# Patient Record
Sex: Female | Born: 1990 | Marital: Single | Smoking: Never smoker
Health system: Southern US, Community
[De-identification: ages and names within clinical notes are randomized; demographics above are authoritative.]

## PROBLEM LIST (undated history)

## (undated) DIAGNOSIS — J45909 Unspecified asthma, uncomplicated: Secondary | ICD-10-CM

---
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
## 2012-11-01 ENCOUNTER — Emergency Department (INDEPENDENT_AMBULATORY_CARE_PROVIDER_SITE_OTHER)
Admission: EM | Admit: 2012-11-01 | Discharge: 2012-11-01 | Disposition: A | Discharge status: Home / Self Care | Payer: Self-pay | Source: Home / Self Care | Attending: Emergency Medicine | Admitting: Emergency Medicine

## 2012-11-01 ENCOUNTER — Encounter (HOSPITAL_COMMUNITY): Payer: Self-pay | Admitting: Emergency Medicine

## 2012-11-01 DIAGNOSIS — J039 Acute tonsillitis, unspecified: Secondary | ICD-10-CM

## 2012-11-01 MED ORDER — PENICILLIN G BENZATHINE 1200000 UNIT/2ML IM SUSP
1.2000 10*6.[IU] | Freq: Once | INTRAMUSCULAR | Status: AC
  Administered 2012-11-01: 1.2 10*6.[IU] via INTRAMUSCULAR

## 2012-11-01 MED ORDER — PENICILLIN G BENZATHINE 1200000 UNIT/2ML IM SUSP
INTRAMUSCULAR | Status: AC
  Filled 2012-11-01: qty 2

## 2012-11-01 NOTE — ED Provider Notes (Signed)
Chief Complaint:   Chief Complaint  Patient presents with  . Sore Throat    History of Present Illness:   Alyssa Roberts is a 22 year old female who has had a two-day history of sore throat, worse on the right than the left with radiation to her right ear and some headache. She denies fever, chills, nasal congestion, rhinorrhea, swollen glands, coughing, or GI symptoms. No known exposure to strep.  Review of Systems:  Other than as noted above, the patient denies any of the following symptoms. Systemic:  No fever, chills, sweats, fatigue, myalgias, headache, or anorexia. Eye:  No redness, pain or drainage. ENT:  No earache, ear congestion, nasal congestion, sneezing, rhinorrhea, sinus pressure, sinus pain, or post nasal drip. Lungs:  No cough, sputum production, wheezing, shortness of breath, or chest pain. GI:  No abdominal pain, nausea, vomiting, or diarrhea. Skin:  No rash or itching.  PMFSH:  Past medical history, family history, social history, meds, allergies, and nurse's notes were reviewed.  There is no known exposure to strep or mono.  No prior history of step or mono.  The patient denies use of tobacco.   Physical Exam:   Vital signs:  BP 100/69  Pulse 54  Temp(Src) 98.7 F (37.1 C) (Oral)  Resp 16  SpO2 100%  LMP 10/13/2012 General:  Alert, in no distress. Eye:  No conjunctival injection or drainage. Lids were normal. ENT:  TMs and canals were normal, without erythema or inflammation.  Nasal mucosa was clear and uncongested, without drainage.  Mucous membranes were moist.  Exam of pharynx reveals tonsils are enlarged, red, and with spots of exudate.  There were no oral ulcerations or lesions. Neck:  Supple, no adenopathy, tenderness or mass. Lungs:  No respiratory distress.  Lungs were clear to auscultation, without wheezes, rales or rhonchi.  Breath sounds were clear and equal bilaterally.  Heart:  Regular rhythm, without gallops, murmers or rubs. Skin:  Clear, warm, and dry,  without rash or lesions.  Labs:   Results for orders placed during the hospital encounter of 11/01/12  POCT RAPID STREP A ([facility] ONLY)      Result Value Range   Streptococcus, Group A Screen (Direct) NEGATIVE  NEGATIVE   Course in Urgent Care Center:   Given LA Bicillin 1.2 million units IM.  Assessment:  The encounter diagnosis was Tonsillitis.  Strep culture is pending.  Plan:   1.  The following meds were prescribed:  There are no discharge medications for this patient.  2.  The patient was instructed in symptomatic care including hot saline gargles, throat lozenges, infectious precautions, and need to trade out toothbrush. Handouts were given. 3.  The patient was told to return if becoming worse in any way, if no better in 3 or 4 days, and given some red flag symptoms such as difficulty swallowing or breathing that would indicate earlier return. 4.  Follow up here if necessary.    Reuben Likes, MD 11/01/12 [phone]

## 2012-11-01 NOTE — ED Notes (Signed)
C/o sore throat.  NAD 

## 2012-11-03 LAB — CULTURE, GROUP A STREP

## 2013-11-30 ENCOUNTER — Encounter (HOSPITAL_COMMUNITY): Payer: Self-pay | Admitting: Emergency Medicine

## 2013-11-30 ENCOUNTER — Emergency Department (INDEPENDENT_AMBULATORY_CARE_PROVIDER_SITE_OTHER)
Admission: EM | Admit: 2013-11-30 | Discharge: 2013-11-30 | Disposition: A | Discharge status: Home / Self Care | Payer: Self-pay | Source: Home / Self Care | Attending: Family Medicine | Admitting: Family Medicine

## 2013-11-30 DIAGNOSIS — J039 Acute tonsillitis, unspecified: Secondary | ICD-10-CM

## 2013-11-30 LAB — POCT RAPID STREP A: Streptococcus, Group A Screen (Direct): NEGATIVE

## 2013-11-30 MED ORDER — AMOXICILLIN 500 MG PO CAPS
500.0000 mg | ORAL_CAPSULE | Freq: Three times a day (TID) | ORAL | Status: DC
Start: 2013-11-30 — End: 2016-06-17

## 2013-11-30 NOTE — ED Notes (Signed)
Patient c/o sore throat with white spots and pain in her ear and neck x 2 days. Patient reports she felt similar sx when she had strep throat before. Patient is alert and oriented and in NAD.

## 2013-11-30 NOTE — ED Provider Notes (Signed)
CSN: [id number]     Arrival date & time 11/30/13  1331 History   First MD Initiated Contact with Patient 11/30/13 1346     Chief Complaint  Patient presents with  . Sore Throat   (Consider location/radiation/quality/duration/timing/severity/associated sxs/prior Treatment) Patient is a 23 y.o. female presenting with pharyngitis. The history is provided by the patient.  Sore Throat This is a new problem. The current episode started 2 days ago. The problem has been gradually worsening.    History reviewed. No pertinent past medical history. History reviewed. No pertinent past surgical history. No family history on file. History  Substance Use Topics  . Smoking status: Never Smoker   . Smokeless tobacco: Not on file  . Alcohol Use: No   OB History   Grav Para Term Preterm Abortions TAB SAB Ect Mult Living                 Review of Systems  Constitutional: Negative.   HENT: Positive for sore throat. Negative for congestion, postnasal drip and rhinorrhea.   Respiratory: Negative.   Cardiovascular: Negative.   Gastrointestinal: Negative.   Hematological: Negative for adenopathy.    Allergies  Review of patient's allergies indicates no known allergies.  Home Medications   Prior to Admission medications   Medication Sig Start Date End Date Taking? Authorizing Provider  amoxicillin (AMOXIL) 500 MG capsule Take 1 capsule (500 mg total) by mouth 3 (three) times daily. 11/30/13   Linna HoffJames D Kindl, MD   BP 118/69  Pulse 65  Temp(Src) 99.5 F (37.5 C) (Oral)  Resp 16  SpO2 98%  LMP 11/23/2013 Physical Exam  Nursing note and vitals reviewed. Constitutional: She is oriented to person, place, and time. She appears well-developed and well-nourished.  HENT:  Head: Normocephalic.  Right Ear: External ear normal.  Left Ear: External ear normal.  Mouth/Throat: Uvula is midline and mucous membranes are normal. Oropharyngeal exudate and posterior oropharyngeal erythema present.  Eyes:  Conjunctivae are normal. Pupils are equal, round, and reactive to light.  Neck: Normal range of motion. Neck supple.  Cardiovascular: Normal heart sounds.   Lymphadenopathy:    She has cervical adenopathy.  Neurological: She is alert and oriented to person, place, and time.    ED Course  Procedures (including critical care time) Labs Review Labs Reviewed  POCT RAPID STREP A ([facility] ONLY)    Imaging Review No results found.   MDM   1. Exudative tonsillitis        Linna HoffJames D Kindl, MD 11/30/13 1445

## 2013-12-02 LAB — CULTURE, GROUP A STREP

## 2014-07-26 ENCOUNTER — Emergency Department (INDEPENDENT_AMBULATORY_CARE_PROVIDER_SITE_OTHER)
Admission: EM | Admit: 2014-07-26 | Discharge: 2014-07-26 | Disposition: A | Discharge status: Home / Self Care | Payer: Self-pay | Source: Home / Self Care | Attending: Family Medicine | Admitting: Family Medicine

## 2014-07-26 DIAGNOSIS — J0391 Acute recurrent tonsillitis, unspecified: Secondary | ICD-10-CM

## 2014-07-26 MED ORDER — CLINDAMYCIN HCL 150 MG PO CAPS: 150.0000 mg | ORAL_CAPSULE | Freq: Four times a day (QID) | ORAL | Status: DC

## 2014-07-26 NOTE — ED Provider Notes (Signed)
CSN: [id number]     Arrival date & time 07/26/14  1423 History   First MD Initiated Contact with Patient 07/26/14 1541     Chief Complaint  Patient presents with  . Sore Throat   (Consider location/radiation/quality/duration/timing/severity/associated sxs/prior Treatment) Patient is a 24 y.o. female presenting with pharyngitis. The history is provided by the patient.  Sore Throat This is a recurrent problem. The current episode started yesterday. The problem has been gradually worsening. The symptoms are aggravated by swallowing.    No past medical history on file. No past surgical history on file. No family history on file. History  Substance Use Topics  . Smoking status: Never Smoker   . Smokeless tobacco: Not on file  . Alcohol Use: No   OB History    No data available     Review of Systems  Constitutional: Negative.   HENT: Positive for sore throat.   Hematological: Positive for adenopathy.    Allergies  Review of patient's allergies indicates no known allergies.  Home Medications   Prior to Admission medications   Medication Sig Start Date End Date Taking? Authorizing Provider  amoxicillin (AMOXIL) 500 MG capsule Take 1 capsule (500 mg total) by mouth 3 (three) times daily. 11/30/13   Linna HoffJames D Ayahna Solazzo, MD  clindamycin (CLEOCIN) 150 MG capsule Take 1 capsule (150 mg total) by mouth 4 (four) times daily. 07/26/14   Linna HoffJames D Philomina Leon, MD   There were no vitals taken for this visit. Physical Exam  Constitutional: She is oriented to person, place, and time. She appears well-developed and well-nourished.  HENT:  Head: Normocephalic.  Right Ear: External ear normal.  Left Ear: External ear normal.  Mouth/Throat: Uvula is midline and mucous membranes are normal. Oropharyngeal exudate, posterior oropharyngeal edema and posterior oropharyngeal erythema present.  Eyes: Conjunctivae are normal. Pupils are equal, round, and reactive to light.  Neck: Normal range of motion. Neck  supple.  Lymphadenopathy:    She has cervical adenopathy.  Neurological: She is alert and oriented to person, place, and time.  Skin: Skin is warm and dry.  Nursing note and vitals reviewed.   ED Course  Procedures (including critical care time) Labs Review Labs Reviewed - No data to display  Imaging Review No results found.   MDM   1. Recurrent tonsillitis        Linna HoffJames D Karlita Lichtman, MD 07/26/14 [phone]

## 2014-07-26 NOTE — ED Notes (Signed)
Pt  Reports      Symptoms  Of   sorethroat  With  Swollen  Glands   And      Spots  On  Back  Of throat          -  Pt    Reports   Has  Had  Throat infections  In  Past          She  Is sitting  Upright  On the   Exam  Table      -

## 2016-06-17 ENCOUNTER — Ambulatory Visit (HOSPITAL_COMMUNITY)
Admission: EM | Admit: 2016-06-17 | Discharge: 2016-06-17 | Disposition: A | Discharge status: Home / Self Care | Payer: Self-pay | Attending: Family Medicine | Admitting: Family Medicine

## 2016-06-17 ENCOUNTER — Ambulatory Visit (INDEPENDENT_AMBULATORY_CARE_PROVIDER_SITE_OTHER): Payer: Self-pay

## 2016-06-17 ENCOUNTER — Encounter (HOSPITAL_COMMUNITY): Payer: Self-pay | Admitting: Family Medicine

## 2016-06-17 DIAGNOSIS — S93411A Sprain of calcaneofibular ligament of right ankle, initial encounter: Secondary | ICD-10-CM

## 2016-06-17 MED ORDER — IBUPROFEN 800 MG PO TABS
800.0000 mg | ORAL_TABLET | Freq: Once | ORAL | Status: AC
  Administered 2016-06-17: 800 mg via ORAL

## 2016-06-17 MED ORDER — MELOXICAM 15 MG PO TABS: 15.0000 mg | ORAL_TABLET | Freq: Every day | ORAL | 2 refills | Status: DC

## 2016-06-17 MED ORDER — IBUPROFEN 800 MG PO TABS
ORAL_TABLET | ORAL | Status: AC
  Filled 2016-06-17: qty 1

## 2016-06-17 NOTE — Discharge Instructions (Signed)
You have a sprained ankle. We have applied an ASO to your ankle and you have been given crutches. I have prescribed a medicine called Meloxicam for pain. Take 1 tablet every day. You may take Tylenol every 4-6 hours for additional pain control, not to exceed 4,000 mg a day of this medicine. I recommend rest, ice, compression through the use of an ASO or Ace bandages, and elevate your ankle as much as possible. Should your pain persist or fail to resolve, follow up with an orthopedist or your primary care provider.  °

## 2016-06-17 NOTE — ED Provider Notes (Signed)
CSN: [id number]     Arrival date & time 06/17/16  1232 History   First MD Initiated Contact with Patient 06/17/16 1328     Chief Complaint  Patient presents with  . Ankle Pain   (Consider location/radiation/quality/duration/timing/severity/associated sxs/prior Treatment) 26 year old female presents with chief complaint of right ankle pain following fall yesterday.   The history is provided by the patient.  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   Entrapped after fall: no   Ankle location:  R ankle Pain details:    Quality:  Throbbing   Radiates to:  Does not radiate   Severity:  Moderate   Onset quality:  Sudden   Duration:  1 day   Timing:  Constant Chronicity:  New Dislocation: no   Foreign body present:  No foreign bodies Tetanus status:  Unknown Prior injury to area:  No Relieved by:  Rest Worsened by:  Bearing weight, extension, flexion and rotation Associated symptoms: decreased ROM, stiffness and swelling   Associated symptoms: no neck pain     History reviewed. No pertinent past medical history. History reviewed. No pertinent surgical history. History reviewed. No pertinent family history. Social History  Substance Use Topics  . Smoking status: Never Smoker  . Smokeless tobacco: Never Used  . Alcohol use No   OB History    No data available     Review of Systems  HENT: Negative.   Respiratory: Negative.   Cardiovascular: Negative.   Genitourinary: Negative.   Musculoskeletal: Positive for joint swelling and stiffness. Negative for myalgias, neck pain and neck stiffness.  Skin: Negative.   Neurological: Negative.   All other systems reviewed and are negative.   Allergies  Patient has no known allergies.  Home Medications   Prior to Admission medications   Medication Sig Start Date End Date  Taking? Authorizing Provider  meloxicam (MOBIC) 15 MG tablet Take 1 tablet (15 mg total) by mouth daily. 06/17/16   Dorena Bodo, NP   Meds Ordered and Administered this Visit   Medications  ibuprofen (ADVIL,MOTRIN) tablet 800 mg (800 mg Oral Given 06/17/16 1351)    BP (!) 107/59   Pulse 74   Temp 98.6 F (37 C)   Resp 18   LMP 05/17/2016 (Approximate)   SpO2 100%  No data found.   Physical Exam  Constitutional: She is oriented to person, place, and time. She appears well-developed and well-nourished. No distress.  HENT:  Head: Normocephalic and atraumatic.  Right Ear: External ear normal.  Left Ear: External ear normal.  Musculoskeletal:       Right ankle: She exhibits decreased range of motion and swelling. Tenderness. CF ligament tenderness found. Achilles tendon exhibits no pain.  Neurological: She is alert and oriented to person, place, and time.  Skin: Skin is warm and dry. Capillary refill takes less than 2 seconds. No rash noted. She is not diaphoretic. No erythema.  Psychiatric: She has a normal mood and affect. Her behavior is normal.  Nursing note and vitals reviewed.   Urgent Care Course     Procedures (including critical care time)  Labs Review Labs Reviewed - No data to display  Imaging Review Dg Ankle Complete Right  Result Date: 06/17/2016 CLINICAL DATA:  Lateral right ankle pain following a fall and  twisting injury 2 days ago. EXAM: RIGHT ANKLE - COMPLETE 3+ VIEW COMPARISON:  None. FINDINGS: There is no evidence of fracture, dislocation, or joint effusion. There is no evidence of arthropathy or other focal bone abnormality. Soft tissues are unremarkable. IMPRESSION: Normal examination. Electronically Signed   By: Beckie Salts M.D.   On: 06/17/2016 13:51       MDM   1. Sprain of calcaneofibular ligament of right ankle, initial encounter    X-ray findings were unremarkable, most likely sprained ankle. Ankle is wrapped in clinic, given crutches,  started on Mobic take and given work excuse. Recommend following up with orthopedics if pain persists     Dorena Bodo, NP 06/17/16 1440

## 2016-06-17 NOTE — ED Triage Notes (Signed)
Pt here for right ankle pain after twisting ankle on Tuesday stepping off of a board.

## 2018-01-08 ENCOUNTER — Encounter (HOSPITAL_COMMUNITY): Payer: Self-pay | Admitting: Emergency Medicine

## 2018-01-08 ENCOUNTER — Ambulatory Visit (HOSPITAL_COMMUNITY)
Admission: EM | Admit: 2018-01-08 | Discharge: 2018-01-08 | Disposition: A | Discharge status: Home / Self Care | Payer: Self-pay | Attending: Family Medicine | Admitting: Family Medicine

## 2018-01-08 DIAGNOSIS — R110 Nausea: Secondary | ICD-10-CM

## 2018-01-08 DIAGNOSIS — K529 Noninfective gastroenteritis and colitis, unspecified: Secondary | ICD-10-CM

## 2018-01-08 DIAGNOSIS — R195 Other fecal abnormalities: Secondary | ICD-10-CM

## 2018-01-08 MED ORDER — ONDANSETRON HCL 4 MG PO TABS: 4.0000 mg | ORAL_TABLET | Freq: Four times a day (QID) | ORAL | 0 refills | Status: DC

## 2018-01-08 NOTE — Discharge Instructions (Signed)

## 2018-01-08 NOTE — ED Provider Notes (Signed)
[facility]   [id number] 01/08/18 Arrival Time: 1406  CC: Nausea and loose stools  SUBJECTIVE:  [place] V Cabriales is a 27 y.o. female who presents with complaint of nausea, and loose stools x 3 episodes that began 1 day ago.  Returned from a cruise yesterday.  Denies abdominal pain.  Has not tried OTC medications.  Symptoms made worse with movement.  Denies similar symptoms in the past.  Last BM this morning with looser stools.  Complains of associated fatigue.    Denies fever, chills, appetite changes, weight changes, vomiting, chest pain, SOB, diarrhea, constipation, hematochezia, melena, dysuria, difficulty urinating, increased frequency or urgency, flank pain, loss of bowel or bladder function, vaginal discharge, vaginal odor, vaginal bleeding, dyspareunia, pelvic pain.     No LMP recorded.  ROS: As per HPI.  History reviewed. No pertinent past medical history. History reviewed. No pertinent surgical history. No Known Allergies No current facility-administered medications on file prior to encounter.    No current outpatient medications on file prior to encounter.   Social History   Socioeconomic History  . Marital status: Single    Spouse name: Not on file  . Number of children: Not on file  . Years of education: Not on file  . Highest education level: Not on file  Occupational History  . Not on file  Social Needs  . Financial resource strain: Not on file  . Food insecurity:    Worry: Not on file    Inability: Not on file  . Transportation needs:    Medical: Not on file    Non-medical: Not on file  Tobacco Use  . Smoking status: Never Smoker  . Smokeless tobacco: Never Used  Substance and Sexual Activity  . Alcohol use: No  . Drug use: No  . Sexual activity: Not on file  Lifestyle  . Physical activity:    Days per week: Not on file    Minutes per session: Not on file  . Stress: Not on file  Relationships  . Social connections:    Talks on phone: Not  on file    Gets together: Not on file    Attends religious service: Not on file    Active member of club or organization: Not on file    Attends meetings of clubs or organizations: Not on file    Relationship status: Not on file  . Intimate partner violence:    Fear of current or ex partner: Not on file    Emotionally abused: Not on file    Physically abused: Not on file    Forced sexual activity: Not on file  Other Topics Concern  . Not on file  Social History Narrative  . Not on file   History reviewed. No pertinent family history.   OBJECTIVE:  Vitals:   01/08/18 1502  BP: 117/85  Pulse: 78  Resp: 18  Temp: 98.7 F (37.1 C)  TempSrc: Oral  SpO2: 98%    General appearance: Alert; NAD HEENT: NCAT.  Oropharynx clear.  Lungs: clear to auscultation bilaterally without adventitious breath sounds Heart: regular rate and rhythm.  Radial pulses 2+ symmetrical bilaterally Abdomen: soft, non-distended; normal active bowel sounds; non-tender to light and deep palpation; nontender at McBurney's point; no guarding Back: no CVA tenderness Extremities: no edema; symmetrical with no gross deformities Skin: warm and dry Neurologic: normal gait Psychological: alert and cooperative; normal mood and affect  ASSESSMENT & PLAN:  1. Nausea   2. Loose stools  Meds ordered this encounter  Medications  . ondansetron (ZOFRAN) 4 MG tablet    Sig: Take 1 tablet (4 mg total) by mouth every 6 (six) hours.    Dispense:  12 tablet    Refill:  0    Order Specific Question:   Supervising Provider    Answer:   Isa Rankin [[id number]]    Get rest and drink fluids Zofran prescribed.  Take as directed.    DIET Instructions:  30 minutes after taking nausea medicine, begin with sips of clear liquids. If able to hold down 2 - 4 ounces for 30 minutes, begin drinking more. Increase your fluid intake to replace losses. Clear liquids only for 24 hours (water, tea, sport drinks, clear  flat ginger ale or cola and juices, broth, jello, popsicles, ect). Advance to bland foods, applesauce, rice, baked or boiled chicken, ect. Avoid milk, greasy foods and anything that doesn't agree with you.  If you experience new or worsening symptoms return or go to ER such as fever, chills, nausea, vomiting, diarrhea, bloody or dark tarry stools, constipation, urinary symptoms, worsening abdominal discomfort, symptoms that do not improve with medications, inability to keep fluids down, etc...  Reviewed expectations re: course of current medical issues. Questions answered. Outlined signs and symptoms indicating need for more acute intervention. Patient verbalized understanding. After Visit Summary given.   Rennis Harding, PA-C 01/08/18 1531

## 2018-01-08 NOTE — ED Triage Notes (Signed)
Pt sts nausea and diarrhea with feeling of "sea sickness" since returning from cruise on Saturday

## 2018-05-06 ENCOUNTER — Emergency Department (HOSPITAL_COMMUNITY)
Admission: EM | Admit: 2018-05-06 | Discharge: 2018-05-06 | Disposition: A | Discharge status: Home / Self Care | Payer: Self-pay | Attending: Emergency Medicine | Admitting: Emergency Medicine

## 2018-05-06 ENCOUNTER — Other Ambulatory Visit: Payer: Self-pay

## 2018-05-06 ENCOUNTER — Encounter (HOSPITAL_COMMUNITY): Payer: Self-pay | Admitting: Oncology

## 2018-05-06 DIAGNOSIS — J029 Acute pharyngitis, unspecified: Secondary | ICD-10-CM | POA: Insufficient documentation

## 2018-05-06 LAB — GROUP A STREP BY PCR: Group A Strep by PCR: NOT DETECTED

## 2018-05-06 MED ORDER — IBUPROFEN 800 MG PO TABS
800.0000 mg | ORAL_TABLET | Freq: Once | ORAL | Status: AC
  Administered 2018-05-06: 800 mg via ORAL
  Filled 2018-05-06: qty 1

## 2018-05-06 NOTE — Discharge Instructions (Signed)
Your strep screen was negative.  Your symptoms are likely due to a viral illness.  We recommend 600 mg ibuprofen every 6 hours for management of pain.  You may use over-the-counter medications such as Chloraseptic spray for continued symptom control.  Drink plenty of fluids to prevent dehydration.

## 2018-05-06 NOTE — ED Triage Notes (Signed)
Pt c/o sore throat x one day.  Reports seeing white patches in the back of her throat.  Pt rates pain 8/10.

## 2018-05-06 NOTE — ED Provider Notes (Signed)
[facility] EMERGENCY DEPARTMENT Provider Note   CSN: [id number] Arrival date & time: 05/06/18  0025    History   Chief Complaint Chief Complaint  Patient presents with  . Sore Throat    HPI Alyssa Roberts is a 28 y.o. female.     The history is provided by the patient. No language interpreter was used.  Sore Throat  This is a new problem. Episode onset: 1.5 days ago. The problem occurs constantly. The problem has been gradually worsening. The symptoms are aggravated by swallowing. Nothing relieves the symptoms. She has tried nothing for the symptoms. The treatment provided no relief.    History reviewed. No pertinent past medical history.  There are no active problems to display for this patient.   History reviewed. No pertinent surgical history.   OB History   No obstetric history on file.      Home Medications    Prior to Admission medications   Not on File    Family History No family history on file.  Social History Social History   Tobacco Use  . Smoking status: Never Smoker  . Smokeless tobacco: Never Used  Substance Use Topics  . Alcohol use: Not Currently  . Drug use: Yes    Types: Marijuana     Allergies   Patient has no known allergies.   Review of Systems Review of Systems  Constitutional: Negative for fever.  HENT: Positive for sore throat. Negative for trouble swallowing.   Ten systems reviewed and are negative for acute change, except as noted in the HPI.    Physical Exam Updated Vital Signs BP 107/62 (BP Location: Left Arm)   Pulse (!) 56   Temp 98.1 F (36.7 C) (Oral)   Resp 18   Ht 5\' 6"  (1.676 m)   Wt 61.2 kg   LMP 04/18/2018 (Approximate)   SpO2 100%   BMI 21.79 kg/m   Physical Exam Vitals signs and nursing note reviewed.  Constitutional:      General: She is not in acute distress.    Appearance: She is well-developed. She is not diaphoretic.     Comments: Nontoxic appearing and in NAD  HENT:      Head: Normocephalic and atraumatic.     Mouth/Throat:     Comments: Normal phonation.  Uvula midline.  There is posterior oropharyngeal erythema.  Scant exudates.  No edema.  Tolerating secretions without difficulty.  No tripoding. Eyes:     General: No scleral icterus.    Conjunctiva/sclera: Conjunctivae normal.  Neck:     Musculoskeletal: Normal range of motion.     Comments: No meningismus Pulmonary:     Effort: Pulmonary effort is normal. No respiratory distress.     Comments: Respirations even and unlabored Musculoskeletal: Normal range of motion.  Skin:    General: Skin is warm and dry.     Coloration: Skin is not pale.     Findings: No erythema or rash.  Neurological:     Mental Status: She is alert and oriented to person, place, and time.  Psychiatric:        Behavior: Behavior normal.      ED Treatments / Results  Labs (all labs ordered are listed, but only abnormal results are displayed) Labs Reviewed  GROUP A STREP BY PCR    EKG None  Radiology No results found.  Procedures Procedures (including critical care time)  Medications Ordered in ED Medications  ibuprofen (ADVIL,MOTRIN) tablet 800 mg (800 mg  Oral Given 05/06/18 0041)     Initial Impression / Assessment and Plan / ED Course  I have reviewed the triage vital signs and the nursing notes.  Pertinent labs & imaging results that were available during my care of the patient were reviewed by me and considered in my medical decision making (see chart for details).        Patient afebrile with negative strep. Presents with sore throat x 1.5 days; diagnosis of viral pharyngitis. No abx indicated. DC with symptomatic tx for pain.  Presentation not concerning for PTA or infxn spread to soft tissue. No trismus or uvula deviation. Return precautions discussed and provided. Patient discharged in stable condition with no unaddressed concerns.   Final Clinical Impressions(s) / ED Diagnoses   Final  diagnoses:  Pharyngitis, unspecified etiology    ED Discharge Orders    None       Antony Madura, PA-C 05/06/18 4580    Glynn Octave, MD 05/06/18 [phone]

## 2018-07-05 ENCOUNTER — Ambulatory Visit (HOSPITAL_COMMUNITY)
Admission: EM | Admit: 2018-07-05 | Discharge: 2018-07-05 | Disposition: A | Discharge status: Home / Self Care | Payer: Self-pay | Attending: Emergency Medicine | Admitting: Emergency Medicine

## 2018-07-05 ENCOUNTER — Encounter (HOSPITAL_COMMUNITY): Payer: Self-pay

## 2018-07-05 ENCOUNTER — Ambulatory Visit (INDEPENDENT_AMBULATORY_CARE_PROVIDER_SITE_OTHER): Payer: Self-pay

## 2018-07-05 DIAGNOSIS — M79671 Pain in right foot: Secondary | ICD-10-CM

## 2018-07-05 DIAGNOSIS — S96911A Strain of unspecified muscle and tendon at ankle and foot level, right foot, initial encounter: Secondary | ICD-10-CM

## 2018-07-05 MED ORDER — NAPROXEN 500 MG PO TABS: 500.0000 mg | ORAL_TABLET | Freq: Two times a day (BID) | ORAL | 0 refills | Status: DC

## 2018-07-05 NOTE — ED Provider Notes (Signed)
[facility]    CSN: [id number] Arrival date & time: 07/05/18  0803     History   Chief Complaint Chief Complaint  Patient presents with  . Ankle Pain    HPI Alyssa Roberts is a 28 y.o. female.   Alyssa Roberts presents with complaints of right dorsal foot pain which started approximately 3 weeks ago. Worse with certain movements or if she steps on anything that then causes extension to her foot. No specific injury but she feels like it did start after she had been running at work. Swelling intermittently. No redness. Pain 5/10. Ibuprofen last dose two days ago. Didn't seem to help with pain. States has had ankle sprains in the past and felt similar. No numbness or tingling. No fevers. No open skin or lesions. She works on her feet regularly, works in [employer]. Without contributing medical history.      ROS per HPI, negative if not otherwise mentioned.      History reviewed. No pertinent past medical history.  There are no active problems to display for this patient.   History reviewed. No pertinent surgical history.  OB History   No obstetric history on file.      Home Medications    Prior to Admission medications   Medication Sig Start Date End Date Taking? Authorizing Provider  naproxen (NAPROSYN) 500 MG tablet Take 1 tablet (500 mg total) by mouth 2 (two) times daily. 07/05/18   Georgetta HaberBurky, Dmarcus Decicco B, NP    Family History Family History  Family history unknown: Yes    Social History Social History   Tobacco Use  . Smoking status: Never Smoker  . Smokeless tobacco: Never Used  Substance Use Topics  . Alcohol use: Not Currently  . Drug use: Yes    Types: Marijuana     Allergies   Patient has no known allergies.   Review of Systems Review of Systems   Physical Exam Triage Vital Signs ED Triage Vitals  Enc Vitals Group     BP 07/05/18 0817 137/90     Pulse Rate 07/05/18 0817 (!) 55     Resp 07/05/18 0817 18     Temp 07/05/18 0817 97.9 F  (36.6 C)     Temp src --      SpO2 07/05/18 0817 100 %     Weight --      Height --      Head Circumference --      Peak Flow --      Pain Score 07/05/18 0815 5     Pain Loc --      Pain Edu? --      Excl. in GC? --    No data found.  Updated Vital Signs BP 137/90   Pulse (!) 55   Temp 97.9 F (36.6 C)   Resp 18   LMP 06/05/2018 (Approximate)   SpO2 100%    Physical Exam Constitutional:      General: She is not in acute distress.    Appearance: She is well-developed.  Cardiovascular:     Rate and Rhythm: Normal rate and regular rhythm.     Heart sounds: Normal heart sounds.  Pulmonary:     Effort: Pulmonary effort is normal.     Breath sounds: Normal breath sounds.  Musculoskeletal:     Right ankle: Normal.     Right foot: Normal capillary refill. Tenderness and bony tenderness present. No swelling, crepitus, deformity or laceration.  Feet:     Comments: Right dorsal, lateral foot with tenderness on palpation; no bruising no swelling; full ROM of ankle and toes; no redness; gross sensation intact; strong pedal pulse; pain with weight bearing with limp noted on ambulation   Skin:    General: Skin is warm and dry.  Neurological:     Mental Status: She is alert and oriented to person, place, and time.      UC Treatments / Results  Labs (all labs ordered are listed, but only abnormal results are displayed) Labs Reviewed - No data to display  EKG None  Radiology Dg Foot Complete Right  Result Date: 07/05/2018 CLINICAL DATA:  28 year old female with right foot pain with no injury EXAM: RIGHT FOOT COMPLETE - 3+ VIEW COMPARISON:  None. FINDINGS: There is no evidence of fracture or dislocation. There is no evidence of arthropathy or other focal bone abnormality. Soft tissues are unremarkable. IMPRESSION: Negative for acute bony abnormality Electronically Signed   By: Gilmer Mor D.O.   On: 07/05/2018 09:21    Procedures Procedures (including critical care  time)  Medications Ordered in UC Medications - No data to display  Initial Impression / Assessment and Plan / UC Course  I have reviewed the triage vital signs and the nursing notes.  Pertinent labs & imaging results that were available during my care of the patient were reviewed by me and considered in my medical decision making (see chart for details).     Xray reassuring. No specific injury. Supportive cares recommended. Follow up with PCP and/or ortho as needed for persistent symptoms. Patient verbalized understanding and agreeable to plan.  Ambulatory out of clinic without difficulty.    Final Clinical Impressions(s) / UC Diagnoses   Final diagnoses:  Strain of right foot, initial encounter     Discharge Instructions     Light and regular activity as tolerated.  Supportive shoes with thick/supportive sole.  ACE wrap or ankle brace for support.  Ice and elevation to help with pain control.  Naproxen twice a day.  Follow up with your PCP and/or orthopedics if no improvement in the next month.     ED Prescriptions    Medication Sig Dispense Auth. Provider   naproxen (NAPROSYN) 500 MG tablet Take 1 tablet (500 mg total) by mouth 2 (two) times daily. 20 tablet Georgetta Haber, NP     Controlled Substance Prescriptions [place] Controlled Substance Registry consulted? Not Applicable   Georgetta Haber, NP 07/05/18 1004

## 2018-07-05 NOTE — Discharge Instructions (Signed)
Light and regular activity as tolerated.  Supportive shoes with thick/supportive sole.  ACE wrap or ankle brace for support.  Ice and elevation to help with pain control.  Naproxen twice a day.  Follow up with your PCP and/or orthopedics if no improvement in the next month.

## 2018-07-05 NOTE — ED Triage Notes (Signed)
Pt states that she began having ankle pain aprx 3 weeks ago, denies injury but states she noticed pain and swelling after running at work

## 2019-07-01 ENCOUNTER — Emergency Department (HOSPITAL_COMMUNITY)
Admission: EM | Admit: 2019-07-01 | Discharge: 2019-07-01 | Disposition: A | Discharge status: Home / Self Care | Payer: Self-pay | Attending: Emergency Medicine | Admitting: Emergency Medicine

## 2019-07-01 ENCOUNTER — Other Ambulatory Visit: Payer: Self-pay

## 2019-07-01 DIAGNOSIS — Y929 Unspecified place or not applicable: Secondary | ICD-10-CM | POA: Insufficient documentation

## 2019-07-01 DIAGNOSIS — Y999 Unspecified external cause status: Secondary | ICD-10-CM | POA: Insufficient documentation

## 2019-07-01 DIAGNOSIS — S39012A Strain of muscle, fascia and tendon of lower back, initial encounter: Secondary | ICD-10-CM | POA: Insufficient documentation

## 2019-07-01 DIAGNOSIS — Y9389 Activity, other specified: Secondary | ICD-10-CM | POA: Insufficient documentation

## 2019-07-01 DIAGNOSIS — X509XXA Other and unspecified overexertion or strenuous movements or postures, initial encounter: Secondary | ICD-10-CM | POA: Insufficient documentation

## 2019-07-01 LAB — POC URINE PREG, ED: Preg Test, Ur: NEGATIVE

## 2019-07-01 MED ORDER — PREDNISONE 20 MG PO TABS: 20.0000 mg | ORAL_TABLET | Freq: Every day | ORAL | 0 refills | Status: AC

## 2019-07-01 MED ORDER — LIDOCAINE 5 % EX PTCH
1.0000 | MEDICATED_PATCH | Freq: Once | CUTANEOUS | Status: DC
  Administered 2019-07-01: 1 via TRANSDERMAL
  Filled 2019-07-01: qty 1

## 2019-07-01 MED ORDER — PREDNISONE 20 MG PO TABS
60.0000 mg | ORAL_TABLET | Freq: Once | ORAL | Status: AC
  Administered 2019-07-01: 60 mg via ORAL
  Filled 2019-07-01: qty 3

## 2019-07-01 MED ORDER — KETOROLAC TROMETHAMINE 15 MG/ML IJ SOLN
15.0000 mg | Freq: Once | INTRAMUSCULAR | Status: AC
  Administered 2019-07-01: 15 mg via INTRAMUSCULAR
  Filled 2019-07-01: qty 1
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
## 2019-07-01 NOTE — ED Triage Notes (Signed)
Pt reports last night was stretching when started having lower back pains that radiates to left buttock. Reports pain was so bad this morning couldn't go to work.

## 2019-07-01 NOTE — Discharge Instructions (Signed)
Your back pain should be treated with medicines such as ibuprofen or tylenol and this back pain should get better over the next 2 weeks.  -Prescription sent to your pharmacy for prednisone. This is a steroid to help with inflammation.   Follow Up: Please follow up with your primary healthcare provider in 1-2 weeks for reassessment. if you do not have a primary care doctor use the resource guide provided to find one.  Low back pain is discomfort in the lower back that may be due to injuries to muscles and ligaments around the spine. Occasionally, it may be caused by a a problem to a part of the spine called a disc. The pain may last several days or a week;  However, most patients get completely well in 4 weeks.   1. Medications: Alternate 600 mg of ibuprofen and [id number] mg of Tylenol every 3 hours as needed for pain. Do not exceed 4000 mg of Tylenol daily.  Take ibuprofen with food to avoid upset stomach issues.   Muscle relaxants:  These medications can help with muscle tightness that is a cause of lower back pain. Most of these medications can cause drowsiness, and it is not safe to drive or use dangerous machinery while taking them.You can take Flexeril as needed for muscle spasm up to twice daily but do not drive, drink alcohol, or operate heavy machinery while taking this medicine because it may make you drowsy.  I typically recommend taking this medicine only at night when you are going to sleep.  You can also cut these tablets in half if they make you feel very drowsy.  2. Treatment: rest, drink plenty of fluids, gentle stretching as discussed (see attached), alternate ice and heat (or stick with whichever feels best) 20 minutes on 20 minutes off. Maintaining your daily activities, including walking, is encourged, as it will help you get better faster than just staying in bed.    Be aware that if you develop new symptoms, such as a fever, leg weakness, difficulty with or loss of control of  your urine or bowels, abdominal pain, or more severe pain, you will need to seek medical attention immediately and  / or return to the Emergency department.

## 2019-07-01 NOTE — ED Notes (Signed)
Pt verbalizes understanding of DC instructions. Pt belongings returned and is ambulatory out of ED.  

## 2019-07-09 ENCOUNTER — Other Ambulatory Visit: Payer: Self-pay

## 2019-07-09 ENCOUNTER — Emergency Department (HOSPITAL_COMMUNITY)
Admission: EM | Admit: 2019-07-09 | Discharge: 2019-07-09 | Disposition: A | Discharge status: Home / Self Care | Payer: Self-pay | Attending: Emergency Medicine | Admitting: Emergency Medicine

## 2019-07-09 ENCOUNTER — Emergency Department (HOSPITAL_COMMUNITY): Payer: Self-pay

## 2019-07-09 ENCOUNTER — Encounter (HOSPITAL_COMMUNITY): Payer: Self-pay

## 2019-07-09 DIAGNOSIS — Y998 Other external cause status: Secondary | ICD-10-CM | POA: Insufficient documentation

## 2019-07-09 DIAGNOSIS — Y929 Unspecified place or not applicable: Secondary | ICD-10-CM | POA: Insufficient documentation

## 2019-07-09 DIAGNOSIS — X58XXXA Exposure to other specified factors, initial encounter: Secondary | ICD-10-CM | POA: Insufficient documentation

## 2019-07-09 DIAGNOSIS — S300XXA Contusion of lower back and pelvis, initial encounter: Secondary | ICD-10-CM | POA: Insufficient documentation

## 2019-07-09 DIAGNOSIS — Y939 Activity, unspecified: Secondary | ICD-10-CM | POA: Insufficient documentation

## 2019-07-09 MED ORDER — TRAMADOL HCL 50 MG PO TABS
50.0000 mg | ORAL_TABLET | Freq: Once | ORAL | Status: DC
Start: 2019-07-09 — End: 2019-07-09
  Filled 2019-07-09: qty 1

## 2019-07-09 MED ORDER — IBUPROFEN 800 MG PO TABS
800.0000 mg | ORAL_TABLET | Freq: Once | ORAL | Status: AC
  Administered 2019-07-09: 800 mg via ORAL
  Filled 2019-07-09: qty 1

## 2019-07-09 MED ORDER — TRAMADOL HCL 50 MG PO TABS: 50.0000 mg | ORAL_TABLET | Freq: Four times a day (QID) | ORAL | 0 refills | Status: DC | PRN

## 2019-07-09 MED ORDER — NAPROXEN 500 MG PO TABS: 500.0000 mg | ORAL_TABLET | Freq: Two times a day (BID) | ORAL | 0 refills | Status: DC

## 2019-07-09 NOTE — Discharge Instructions (Addendum)
Naproxen as prescribed.  Tramadol as prescribed as needed for pain not relieved with naproxen.  Follow-up with your primary doctor if symptoms or not improving in the next week.
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
## 2019-07-09 NOTE — ED Triage Notes (Signed)
Back pain radiating down left leg. Seen here last week for same.

## 2020-01-29 ENCOUNTER — Encounter (HOSPITAL_COMMUNITY): Payer: Self-pay | Admitting: *Deleted

## 2020-01-29 ENCOUNTER — Other Ambulatory Visit: Payer: Self-pay

## 2020-01-29 ENCOUNTER — Ambulatory Visit (HOSPITAL_COMMUNITY)
Admission: EM | Admit: 2020-01-29 | Discharge: 2020-01-29 | Disposition: A | Discharge status: Home / Self Care | Payer: Self-pay | Attending: Family Medicine | Admitting: Family Medicine

## 2020-01-29 DIAGNOSIS — J4541 Moderate persistent asthma with (acute) exacerbation: Secondary | ICD-10-CM

## 2020-01-29 HISTORY — DX: Unspecified asthma, uncomplicated: J45.909

## 2020-01-29 MED ORDER — ALBUTEROL SULFATE HFA 108 (90 BASE) MCG/ACT IN AERS: 1.0000 | INHALATION_SPRAY | Freq: Four times a day (QID) | RESPIRATORY_TRACT | 2 refills | Status: DC | PRN

## 2020-01-29 MED ORDER — PREDNISONE 20 MG PO TABS: 40.0000 mg | ORAL_TABLET | Freq: Every day | ORAL | 0 refills | Status: DC

## 2020-01-29 NOTE — ED Triage Notes (Signed)
Pt reports a HA for one month and a HA. Pt reports she only has a few doses on her HNN.

## 2020-01-30 NOTE — ED Provider Notes (Signed)
[facility]   [id number] 01/29/20 Arrival Time: 1616  ASSESSMENT & PLAN:  1. Moderate persistent asthma with exacerbation     No resp distress.  Meds ordered this encounter  Medications  . albuterol (VENTOLIN HFA) 108 (90 Base) MCG/ACT inhaler    Sig: Inhale 1-2 puffs into the lungs every 6 (six) hours as needed for wheezing or shortness of breath.    Dispense:  1 each    Refill:  2  . predniSONE (DELTASONE) 20 MG tablet    Sig: Take 2 tablets (40 mg total) by mouth daily.    Dispense:  10 tablet    Refill:  0    Asthma precautions given. OTC symptom care as needed.  Recommend:  Follow-up Information    [facility].   Specialty: Urgent Care Why: If worsening or failing to improve as anticipated. Contact information: [address] [place] [address] [phone]              Reviewed expectations re: course of current medical issues. Questions answered. Outlined signs and symptoms indicating need for more acute intervention. Patient verbalized understanding. After Visit Summary given.  SUBJECTIVE: History from: patient.  Alyssa Roberts is a 29 y.o. female who presents with complaint of fairly persistent wheezing. Onset gradual, few d ago. Triggers: change in weather. Describes wheezing as moderate when present. Fever: no. Overall normal PO intake without n/v. Sick contacts: no. Ambulatory without difficulty. No LE edema. Typically her asthma is well controlled. Inhaler use: more frequent. OTC treatment: none.   Social History   Tobacco Use  Smoking Status Never Smoker  Smokeless Tobacco Never Used      OBJECTIVE:  Vitals:   01/29/20 1734 01/29/20 1737  BP: 132/82   Pulse: 65   Resp: 18   Temp: 98.5 F (36.9 C)   TempSrc: Oral   SpO2: 100%   Weight:  63.5 kg  Height:  5\' 6"  (1.676 m)     General appearance: alert; NAD HEENT: [place]; AT; with mild nasal congestion Neck: supple without  LAD Cv: RRR without murmer Lungs: unlabored respirations, moderate bilateral expiratory wheezing; cough: absent; no significant respiratory distress Skin: warm and dry Psychological: alert and cooperative; normal mood and affect    No Known Allergies  Past Medical History:  Diagnosis Date  . Asthma    History reviewed. No pertinent family history. Social History   Socioeconomic History  . Marital status: Single    Spouse name: Not on file  . Number of children: Not on file  . Years of education: Not on file  . Highest education level: Not on file  Occupational History  . Not on file  Tobacco Use  . Smoking status: Never Smoker  . Smokeless tobacco: Never Used  Substance and Sexual Activity  . Alcohol use: No  . Drug use: No  . Sexual activity: Not on file  Other Topics Concern  . Not on file  Social History Narrative  . Not on file   Social Determinants of Health   Financial Resource Strain:   . Difficulty of Paying Living Expenses: Not on file  Food Insecurity:   . Worried About in the Last Year: Not on file  . Ran Out of Food in the Last Year: Not on file  Transportation Needs:   . Lack of Transportation (Medical): Not on file  . Lack of Transportation (Non-Medical): Not on file  Physical Activity:   .  Days of Exercise per Week: Not on file  . Minutes of Exercise per Session: Not on file  Stress:   . Feeling of Stress : Not on file  Social Connections:   . Frequency of Communication with Friends and Family: Not on file  . Frequency of Social Gatherings with Friends and Family: Not on file  . Attends Religious Services: Not on file  . Active Member of Clubs or Organizations: Not on file  . Attends Banker Meetings: Not on file  . Marital Status: Not on file  Intimate Partner Violence:   . Fear of Current or Ex-Partner: Not on file  . Emotionally Abused: Not on file  . Physically Abused: Not on file  . Sexually Abused:  Not on file            Mardella Layman, MD 01/30/20 [phone]

## 2020-03-02 ENCOUNTER — Ambulatory Visit (HOSPITAL_COMMUNITY)
Admission: EM | Admit: 2020-03-02 | Discharge: 2020-03-02 | Disposition: A | Discharge status: Home / Self Care | Payer: 59 | Attending: Emergency Medicine | Admitting: Emergency Medicine

## 2020-03-02 ENCOUNTER — Other Ambulatory Visit: Payer: Self-pay

## 2020-03-02 ENCOUNTER — Ambulatory Visit (INDEPENDENT_AMBULATORY_CARE_PROVIDER_SITE_OTHER): Payer: 59

## 2020-03-02 ENCOUNTER — Encounter (HOSPITAL_COMMUNITY): Payer: Self-pay

## 2020-03-02 DIAGNOSIS — J4521 Mild intermittent asthma with (acute) exacerbation: Secondary | ICD-10-CM

## 2020-03-02 DIAGNOSIS — R0602 Shortness of breath: Secondary | ICD-10-CM

## 2020-03-02 DIAGNOSIS — J45909 Unspecified asthma, uncomplicated: Secondary | ICD-10-CM | POA: Diagnosis not present

## 2020-03-02 MED ORDER — AEROCHAMBER MV MISC: 2 refills | Status: AC

## 2020-03-02 MED ORDER — ALBUTEROL SULFATE HFA 108 (90 BASE) MCG/ACT IN AERS: 1.0000 | INHALATION_SPRAY | Freq: Four times a day (QID) | RESPIRATORY_TRACT | 2 refills | Status: AC | PRN

## 2020-03-02 NOTE — ED Provider Notes (Signed)
[facility]    CSN: [id number] Arrival date & time: 03/02/20  1022      History   Chief Complaint Chief Complaint  Patient presents with  . asthma issues    HPI Alyssa Roberts is a 30 y.o. female.   HPI   30 year old female here for evaluation of shortness of breath.  Patient reports that she has had symptoms for the past 2 months.  She has been using her albuterol rescue inhaler without a spacer with some mild relief.  Patient states that she gets about 5 hours of sleep at night but then wakes up because she feels like mucus has accumulated in her throat and she has to cough which causes increased chest tightness.  Patient states that she does not have wheezing that she is aware of unless she starts coughing.  Patient denies any fever.  Patient reports that her cough is productive for a white mucus.  Patient is an ex-smoker.  She stopped vapes about 2 months ago and does not smoke tobacco or marijuana.  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  Past Medical History:  Diagnosis Date  . Asthma     There are no problems to display for this patient.   History reviewed. No pertinent surgical history.  OB History   No obstetric history on file.      Home Medications    Prior to Admission medications   Medication Sig Start Date End Date Taking? Authorizing Provider  Spacer/Aero-Holding Chambers (AEROCHAMBER MV) inhaler Use as instructed 03/02/20  Yes Margarette Canada, NP  albuterol (VENTOLIN HFA) 108 (90 Base) MCG/ACT inhaler Inhale 1-2 puffs into the lungs every 6 (six) hours as needed for wheezing or shortness of breath. 03/02/20   Margarette Canada, NP  naproxen (NAPROSYN) 500 MG tablet Take 1 tablet (500 mg total) by mouth 2 (two) times daily. 07/05/18   Zigmund Gottron, NP  naproxen (NAPROSYN) 500 MG tablet Take 1 tablet (500 mg total) by mouth 2 (two) times daily. 07/09/19   Veryl Speak, MD  ondansetron (ZOFRAN) 4 MG tablet Take 1 tablet (4 mg total)  by mouth every 6 (six) hours. Patient not taking: Reported on 07/09/2019 01/08/18   Wurst, [place], PA-C  predniSONE (DELTASONE) 20 MG tablet Take 2 tablets (40 mg total) by mouth daily. 01/29/20   Vanessa Kick, MD  traMADol (ULTRAM) 50 MG tablet Take 1 tablet (50 mg total) by mouth every 6 (six) hours as needed. 07/09/19   Veryl Speak, MD    Family History Family History  Family history unknown: Yes    Social History Social History   Tobacco Use  . Smoking status: Never Smoker  . Smokeless tobacco: Never Used  Substance Use Topics  . Alcohol use: Not Currently  . Drug use: Yes    Types: Marijuana     Allergies   Prednisone   Review of Systems Review of Systems  Constitutional: Negative for activity change, appetite change and fever.  HENT: Negative for congestion, rhinorrhea and sore throat.   Respiratory: Positive for cough, shortness of breath and wheezing.   Skin: Negative for rash.  Hematological: Negative.   Psychiatric/Behavioral: Negative.      Physical Exam Triage Vital Signs ED Triage Vitals  Enc Vitals Group     BP 03/02/20 1128 (!) 137/93     Pulse Rate 03/02/20 1128 66     Resp 03/02/20 1128 18     Temp 03/02/20 1128 98.2  F (36.8 C)     Temp Source 03/02/20 1128 Oral     SpO2 03/02/20 1128 97 %     Weight --      Height --      Head Circumference --      Peak Flow --      Pain Score 03/02/20 1123 0     Pain Loc --      Pain Edu? --      Excl. in GC? --    No data found.  Updated Vital Signs BP (!) 137/93 (BP Location: Left Arm)   Pulse 66   Temp 98.2 F (36.8 C) (Oral)   Resp 18   LMP 02/25/2020 (Approximate)   SpO2 97%   Visual Acuity Right Eye Distance:   Left Eye Distance:   Bilateral Distance:    Right Eye Near:   Left Eye Near:    Bilateral Near:     Physical Exam Vitals and nursing note reviewed.  Constitutional:      General: She is not in acute distress.    Appearance: Normal appearance. She is normal weight.  She is not toxic-appearing.  HENT:     Head: Normocephalic and atraumatic.     Mouth/Throat:     Mouth: Mucous membranes are moist.     Pharynx: Oropharynx is clear. No oropharyngeal exudate or posterior oropharyngeal erythema.  Cardiovascular:     Rate and Rhythm: Normal rate and regular rhythm.     Pulses: Normal pulses.     Heart sounds: Normal heart sounds. No murmur heard. No gallop.   Pulmonary:     Effort: Pulmonary effort is normal.     Breath sounds: Normal breath sounds. No stridor. No wheezing, rhonchi or rales.  Musculoskeletal:     Cervical back: Normal range of motion and neck supple.  Lymphadenopathy:     Cervical: No cervical adenopathy.  Skin:    General: Skin is warm and dry.     Capillary Refill: Capillary refill takes less than 2 seconds.     Findings: No erythema or rash.  Neurological:     General: No focal deficit present.     Mental Status: She is alert and oriented to person, place, and time.  Psychiatric:        Mood and Affect: Mood normal.        Behavior: Behavior normal.        Thought Content: Thought content normal.        Judgment: Judgment normal.      UC Treatments / Results  Labs (all labs ordered are listed, but only abnormal results are displayed) Labs Reviewed - No data to display  EKG   Radiology DG Chest 2 View  Result Date: 03/02/2020 CLINICAL DATA:  Asthma, shortness of breath. EXAM: CHEST - 2 VIEW COMPARISON:  None. FINDINGS: The heart size and mediastinal contours are within normal limits. Both lungs are clear. The visualized skeletal structures are unremarkable. IMPRESSION: No active cardiopulmonary disease. Electronically Signed   By: Sherian Rein M.D.   On: 03/02/2020 11:59    Procedures Procedures (including critical care time)  Medications Ordered in UC Medications - No data to display  Initial Impression / Assessment and Plan / UC Course  I have reviewed the triage vital signs and the nursing notes.  Pertinent  labs & imaging results that were available during my care of the patient were reviewed by me and considered in my medical decision making (see chart for  details).   Patient is here for evaluation of ongoing shortness of breath for the past 2 months.  Patient does have a history of asthma and has a rescue inhaler but is not being managed with a LABA or inhaled corticosteroids.  Patient has a new PCP but does not have an appointment till April 08, 2020.  She is here for evaluation of continued shortness of breath and what she feels like mucus accumulation in her throat that wakes her up at night coughing.  Patient used to vape but she is stopped.  Patient is not in any respiratory distress and her lungs are clear to auscultation all fields.  No dyspnea or tachypnea.  Will obtain chest x-ray to look for hyperinflation.  Plan is to discharge patient out with continued use of her rescue inhaler, provide a spacer, and a short course of prednisone to bridge her until she sees her PCP.  Radiology read of chest x-ray shows no acute intrathoracic process.  We will discharge patient with diagnosis of asthma exacerbation.  Will give patient a prescription for a spacer to use with her inhaler and have her follow-up with her primary care provider.  Patient gets hives from prednisone.  I discussed with patient the need for allergy testing to see if she can have other corticosteroids as these may help better manage her asthma.  Final Clinical Impressions(s) / UC Diagnoses   Final diagnoses:  Mild intermittent asthma with acute exacerbation     Discharge Instructions     Use the albuterol inhaler with a spacer, 2 puffs every 4-6 hours, as needed for shortness of breath or wheezing.  You can take 2 puffs with a spacer before any sort of physical activity to help prevent symptoms from occurring.  Keep your appointment on April 02, 2020 as scheduled with your primary care provider to discuss ways to better  manage your asthma.  As we discussed, you may need to have allergy testing to determine if you can use other forms of steroids besides prednisone as these may help better manage your asthma symptoms.  Continue to abstain from vaping.    ED Prescriptions    Medication Sig Dispense Auth. Provider   Spacer/Aero-Holding Chambers (AEROCHAMBER MV) inhaler Use as instructed 1 each Becky Augusta, NP   albuterol (VENTOLIN HFA) 108 (90 Base) MCG/ACT inhaler Inhale 1-2 puffs into the lungs every 6 (six) hours as needed for wheezing or shortness of breath. 1 each Becky Augusta, NP     PDMP not reviewed this encounter.   Becky Augusta, NP 03/02/20 1214

## 2020-03-02 NOTE — Discharge Instructions (Addendum)
Use the albuterol inhaler with a spacer, 2 puffs every 4-6 hours, as needed for shortness of breath or wheezing.  You can take 2 puffs with a spacer before any sort of physical activity to help prevent symptoms from occurring.  Keep your appointment on April 02, 2020 as scheduled with your primary care provider to discuss ways to better manage your asthma.  As we discussed, you may need to have allergy testing to determine if you can use other forms of steroids besides prednisone as these may help better manage your asthma symptoms.  Continue to abstain from vaping.

## 2020-03-02 NOTE — ED Triage Notes (Addendum)
Pt in with c/o sob that has been going on for 2 months but is not going away. States she stopped smoking vapes but is still having difficulty breathing sometimes. States she feels like she has mucus stuck in her throat when she breathes  Pt has been using her albuterol inhaler with some relief

## 2020-05-08 ENCOUNTER — Other Ambulatory Visit: Payer: Self-pay | Admitting: Internal Medicine

## 2020-05-09 LAB — LIPID PANEL
Cholesterol: 139 mg/dL (ref <200)
HDL: 86 mg/dL (ref >=50)
LDL Cholesterol (Calc): 39 mg/dL (calc)
Non-HDL Cholesterol (Calc): 53 mg/dL (calc) (ref <130)
Total CHOL/HDL Ratio: 1.6 (calc) (ref <5.0)
Triglycerides: 64 mg/dL (ref <150)

## 2020-05-09 LAB — COMPLETE METABOLIC PANEL WITH GFR
AG Ratio: 1.7 (calc) (ref 1.0–2.5)
ALT: 14 U/L (ref 6–29)
AST: 20 U/L (ref 10–30)
Albumin: 4.5 g/dL (ref 3.6–5.1)
Alkaline phosphatase (APISO): 50 U/L (ref 31–125)
BUN: 15 mg/dL (ref 7–25)
CO2: 21 mmol/L (ref 20–32)
Calcium: 9.3 mg/dL (ref 8.6–10.2)
Chloride: 104 mmol/L (ref 98–110)
Creat: 1.03 mg/dL (ref 0.50–1.10)
GFR, Est African American: 85 mL/min/{1.73_m2} (ref >=60)
GFR, Est Non African American: 73 mL/min/{1.73_m2} (ref >=60)
Globulin: 2.6 g/dL (calc) (ref 1.9–3.7)
Glucose, Bld: 74 mg/dL (ref 65–99)
Potassium: 4.3 mmol/L (ref 3.5–5.3)
Sodium: 136 mmol/L (ref 135–146)
Total Bilirubin: 0.5 mg/dL (ref 0.2–1.2)
Total Protein: 7.1 g/dL (ref 6.1–8.1)

## 2020-05-09 LAB — CBC
HCT: 36.7 % (ref 35.0–45.0)
Hemoglobin: 12.2 g/dL (ref 11.7–15.5)
MCH: 33.3 pg — ABNORMAL HIGH (ref 27.0–33.0)
MCHC: 33.2 g/dL (ref 32.0–36.0)
MCV: 100.3 fL — ABNORMAL HIGH (ref 80.0–100.0)
MPV: 9 fL (ref 7.5–12.5)
Platelets: 289 10*3/uL (ref 140–400)
RBC: 3.66 10*6/uL — ABNORMAL LOW (ref 3.80–5.10)
RDW: 11.8 % (ref 11.0–15.0)
WBC: 6.7 10*3/uL (ref 3.8–10.8)

## 2020-05-09 LAB — TSH: TSH: 0.64 mIU/L

## 2020-05-09 LAB — VITAMIN D 25 HYDROXY (VIT D DEFICIENCY, FRACTURES): Vit D, 25-Hydroxy: 30 ng/mL (ref 30–100)

## 2020-06-11 ENCOUNTER — Encounter (HOSPITAL_COMMUNITY): Payer: Self-pay | Admitting: *Deleted

## 2020-06-11 ENCOUNTER — Emergency Department (HOSPITAL_COMMUNITY)
Admission: EM | Admit: 2020-06-11 | Discharge: 2020-06-11 | Disposition: A | Discharge status: Home / Self Care | Payer: 59 | Attending: Emergency Medicine | Admitting: Emergency Medicine

## 2020-06-11 DIAGNOSIS — S161XXA Strain of muscle, fascia and tendon at neck level, initial encounter: Secondary | ICD-10-CM | POA: Insufficient documentation

## 2020-06-11 DIAGNOSIS — R519 Headache, unspecified: Secondary | ICD-10-CM | POA: Insufficient documentation

## 2020-06-11 DIAGNOSIS — S134XXA Sprain of ligaments of cervical spine, initial encounter: Secondary | ICD-10-CM

## 2020-06-11 DIAGNOSIS — Y92410 Unspecified street and highway as the place of occurrence of the external cause: Secondary | ICD-10-CM | POA: Insufficient documentation

## 2020-06-11 DIAGNOSIS — S199XXA Unspecified injury of neck, initial encounter: Secondary | ICD-10-CM | POA: Diagnosis present

## 2020-06-11 DIAGNOSIS — Y9389 Activity, other specified: Secondary | ICD-10-CM | POA: Insufficient documentation

## 2020-06-11 DIAGNOSIS — J45909 Unspecified asthma, uncomplicated: Secondary | ICD-10-CM | POA: Diagnosis not present

## 2020-06-11 MED ORDER — CYCLOBENZAPRINE HCL 10 MG PO TABS: 10.0000 mg | ORAL_TABLET | Freq: Two times a day (BID) | ORAL | 0 refills | Status: DC | PRN
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
## 2020-06-11 NOTE — ED Notes (Signed)
An After Visit Summary was printed and given to the patient. Discharge instructions given and no further questions at this time.  

## 2020-09-02 ENCOUNTER — Encounter (HOSPITAL_COMMUNITY): Payer: Self-pay | Admitting: Emergency Medicine

## 2020-09-02 ENCOUNTER — Other Ambulatory Visit: Payer: Self-pay

## 2020-09-02 ENCOUNTER — Emergency Department (HOSPITAL_COMMUNITY): Payer: 59

## 2020-09-02 ENCOUNTER — Emergency Department (HOSPITAL_COMMUNITY)
Admission: EM | Admit: 2020-09-02 | Discharge: 2020-09-02 | Disposition: A | Discharge status: Home / Self Care | Payer: 59 | Attending: Emergency Medicine | Admitting: Emergency Medicine

## 2020-09-02 DIAGNOSIS — W228XXA Striking against or struck by other objects, initial encounter: Secondary | ICD-10-CM | POA: Diagnosis not present

## 2020-09-02 DIAGNOSIS — J45909 Unspecified asthma, uncomplicated: Secondary | ICD-10-CM | POA: Insufficient documentation

## 2020-09-02 DIAGNOSIS — Y9389 Activity, other specified: Secondary | ICD-10-CM | POA: Diagnosis not present

## 2020-09-02 DIAGNOSIS — Y990 Civilian activity done for income or pay: Secondary | ICD-10-CM | POA: Diagnosis not present

## 2020-09-02 DIAGNOSIS — Y9289 Other specified places as the place of occurrence of the external cause: Secondary | ICD-10-CM | POA: Insufficient documentation

## 2020-09-02 DIAGNOSIS — M79645 Pain in left finger(s): Secondary | ICD-10-CM | POA: Insufficient documentation

## 2020-09-02 MED ORDER — IBUPROFEN 200 MG PO TABS
600.0000 mg | ORAL_TABLET | Freq: Once | ORAL | Status: AC
  Administered 2020-09-02: 600 mg via ORAL
  Filled 2020-09-02: qty 3

## 2020-09-02 NOTE — ED Triage Notes (Addendum)
Patient arrives complaining of left middle finger pain. Patient states she thinks she "swiped" it against a metal bar at work. Patient has FROM in hand and finger. Patient able to pull on finger and put pressure on joint during triage. No outward injury. No redness, bruising or swelling noted. Patient did not attempt ice or OTC medications. Patient able to comfortably use phone during triage.

## 2020-09-02 NOTE — Discharge Instructions (Addendum)
Thank you for allowing me to care for you today in the Emergency Department.   Take 650 mg of Tylenol or 600 mg of ibuprofen with food every 6 hours for pain.  You can alternate between these 2 medications every 3 hours if your pain returns.  For instance, you can take Tylenol at noon, followed by a dose of ibuprofen at 3, followed by second dose of Tylenol and 6.  You can apply an ice pack for 15 to 20 minutes up to 3-4 times a day for the next 5 days.  Try elevating your finger at or above the level of your heart to help with pain and swelling.  You can buddy tape the finger to your second or fourth finger as needed to help with pain.  If your pain does not significantly improve with this regimen over the next 5 to 7 days, please follow-up with primary care.  Return to the emergency department if you have any fall or injury, if you develop severe redness, swelling, fever, chills, red streaking to the finger.

## 2020-09-02 NOTE — ED Provider Notes (Signed)
[facility]-EMERGENCY DEPT Provider Note   CSN: [id number] Arrival date & time: 09/02/20  0056     History No chief complaint on file.   Alyssa Roberts is a 30 y.o. female with a history of asthma who presents to the emergency department with a chief complaint of finger pain.  The patient reports sudden onset, constant left middle finger pain that began yesterday when she hit it against a metal frame at work.  Pain is worse with movement.  Improved with keeping her finger fully extended.  She denies numbness, weakness, swelling, wounds, fever, chills, redness, or warmth.  She attempted to pull on the finger to see if the joint would "pop" with no improvement in her symptoms.  No other treatments prior to arrival.  She is left-hand dominant.  She has a remote history of a left hand fracture.  The history is provided by medical records and the patient. No language interpreter was used.      Past Medical History:  Diagnosis Date   Asthma     There are no problems to display for this patient.   History reviewed. No pertinent surgical history.   OB History   No obstetric history on file.     Family History  Family history unknown: Yes    Social History   Tobacco Use   Smoking status: Never   Smokeless tobacco: Never  Substance Use Topics   Alcohol use: Not Currently   Drug use: Yes    Types: Marijuana    Home Medications Prior to Admission medications   Medication Sig Start Date End Date Taking? Authorizing Provider  albuterol (VENTOLIN HFA) 108 (90 Base) MCG/ACT inhaler Inhale 1-2 puffs into the lungs every 6 (six) hours as needed for wheezing or shortness of breath. 03/02/20  Yes Becky Augusta, NP  Spacer/Aero-Holding Deretha Emory (AEROCHAMBER MV) inhaler Use as instructed 03/02/20  Yes Becky Augusta, NP    Allergies    Prednisone  Review of Systems   Review of Systems  Constitutional:  Negative for activity change, chills and fever.  Respiratory:   Negative for shortness of breath.   Cardiovascular:  Negative for chest pain.  Gastrointestinal:  Negative for abdominal pain.  Musculoskeletal:  Positive for arthralgias and myalgias. Negative for back pain and joint swelling.  Skin:  Negative for color change, rash and wound.  Neurological:  Negative for syncope and weakness.   Physical Exam Updated Vital Signs BP 133/76 (BP Location: Left Arm)   Pulse 63   Temp 98.1 F (36.7 C) (Oral)   Resp 16   Ht 5\' 6"  (1.676 m)   Wt 63.5 kg   LMP 08/05/2020   SpO2 100%   BMI 22.60 kg/m   Physical Exam Vitals and nursing note reviewed.  Constitutional:      General: She is not in acute distress. HENT:     Head: Normocephalic.  Eyes:     Conjunctiva/sclera: Conjunctivae normal.  Cardiovascular:     Rate and Rhythm: Normal rate and regular rhythm.     Heart sounds: No murmur heard.   No friction rub. No gallop.  Pulmonary:     Effort: Pulmonary effort is normal. No respiratory distress.  Abdominal:     General: There is no distension.     Palpations: Abdomen is soft.  Musculoskeletal:     Cervical back: Neck supple.     Comments: Full active and passive range of motion of all joints in the left hand and  fingers.  No swelling, erythema, redness, warmth, red streaking.  She is neurovascular intact.  Tender to palpation to the distal phalanx of the left third digit.  No crepitus or deformities.  No ecchymosis.  Skin:    General: Skin is warm.     Findings: No rash.  Neurological:     Mental Status: She is alert.  Psychiatric:        Behavior: Behavior normal.    ED Results / Procedures / Treatments   Labs (all labs ordered are listed, but only abnormal results are displayed) Labs Reviewed - No data to display  EKG None  Radiology DG Finger Middle Left  Result Date: 09/02/2020 CLINICAL DATA:  Pain in the left third finger after injury while moving furniture 3 days ago. EXAM: LEFT MIDDLE FINGER 2+V COMPARISON:  None.  FINDINGS: There is no evidence of fracture or dislocation. There is no evidence of arthropathy or other focal bone abnormality. Soft tissues are unremarkable. IMPRESSION: Negative. Electronically Signed   By: Burman Nieves M.D.   On: 09/02/2020 02:51    Procedures Procedures   Medications Ordered in ED Medications  ibuprofen (ADVIL) tablet 600 mg (600 mg Oral Given 09/02/20 0211)    ED Course  I have reviewed the triage vital signs and the nursing notes.  Pertinent labs & imaging results that were available during my care of the patient were reviewed by me and considered in my medical decision making (see chart for details).    MDM Rules/Calculators/A&P                          29 year old female who presents the emergency department with left middle finger pain began yesterday when she was at work when she hit the digit on a metal frame.   Vital signs are stable.  She is neurovascular intact on exam.  No wounds noted on exam.  Doubt gout, cellulitis, felon, or paronychia based on clinical exam.  She does have tenderness to the distal phalanx of the left middle digit.  X-ray has been reviewed and independently interpreted by me.  No acute findings.  Patient's digits were buddy taped for comfort.  She was given ibuprofen.  She will follow-up with her primary care provider.  She is hemodynamically stable and in no acute distress.  Safe for discharge home with outpatient follow-up as discussed.  Final Clinical Impression(s) / ED Diagnoses Final diagnoses:  Pain of finger of left hand    Rx / DC Orders ED Discharge Orders     None        Barkley Boards, PA-C 09/02/20 0443    Gilda Crease, MD 09/02/20 [phone]

## 2020-09-17 ENCOUNTER — Encounter: Payer: 59 | Admitting: Obstetrics

## 2020-10-06 ENCOUNTER — Emergency Department (HOSPITAL_COMMUNITY)
Admission: EM | Admit: 2020-10-06 | Discharge: 2020-10-06 | Disposition: A | Discharge status: Home / Self Care | Payer: 59 | Attending: Emergency Medicine | Admitting: Emergency Medicine

## 2020-10-06 ENCOUNTER — Encounter (HOSPITAL_COMMUNITY): Payer: Self-pay | Admitting: Emergency Medicine

## 2020-10-06 ENCOUNTER — Other Ambulatory Visit: Payer: Self-pay

## 2020-10-06 DIAGNOSIS — S63654A Sprain of metacarpophalangeal joint of right ring finger, initial encounter: Secondary | ICD-10-CM | POA: Diagnosis not present

## 2020-10-06 DIAGNOSIS — Z7951 Long term (current) use of inhaled steroids: Secondary | ICD-10-CM | POA: Insufficient documentation

## 2020-10-06 DIAGNOSIS — M79641 Pain in right hand: Secondary | ICD-10-CM | POA: Insufficient documentation

## 2020-10-06 DIAGNOSIS — X500XXA Overexertion from strenuous movement or load, initial encounter: Secondary | ICD-10-CM | POA: Diagnosis not present

## 2020-10-06 DIAGNOSIS — Y990 Civilian activity done for income or pay: Secondary | ICD-10-CM | POA: Diagnosis not present

## 2020-10-06 DIAGNOSIS — S6991XA Unspecified injury of right wrist, hand and finger(s), initial encounter: Secondary | ICD-10-CM | POA: Diagnosis present

## 2020-10-06 DIAGNOSIS — J45909 Unspecified asthma, uncomplicated: Secondary | ICD-10-CM | POA: Diagnosis not present

## 2020-10-06 MED ORDER — ACETAMINOPHEN 500 MG PO TABS
1000.0000 mg | ORAL_TABLET | Freq: Once | ORAL | Status: AC
  Administered 2020-10-06: 1000 mg via ORAL
  Filled 2020-10-06: qty 2

## 2020-10-06 NOTE — ED Triage Notes (Signed)
Pt injured her R ring finger at work about a week ago. She saw the in house employee health provider last week. States that she hasnt had an xray. States that that finger has continued swelling over the past week. Full ROM. States it kind of feels like it needs to "pop."

## 2020-10-06 NOTE — ED Provider Notes (Signed)
[facility]-EMERGENCY DEPT Provider Note  CSN: [id number] Arrival date & time: 10/06/20 8676  Chief Complaint(s) Hand Pain  HPI Alyssa Roberts is a 30 y.o. female   The history is provided by the patient.  Hand Pain This is a new problem. Episode onset: 5 days. The problem occurs constantly. The problem has not changed since onset.Pertinent negatives include no chest pain, no abdominal pain, no headaches and no shortness of breath. Exacerbated by: movement and lifting. Nothing relieves the symptoms. She has tried nothing for the symptoms.   She doesn't remember any trauma to the hand. 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  Past Medical History Past Medical History:  Diagnosis Date   Asthma    There are no problems to display for this patient.  Home Medication(s) Prior to Admission medications   Medication Sig Start Date End Date Taking? Authorizing Provider  albuterol (VENTOLIN HFA) 108 (90 Base) MCG/ACT inhaler Inhale 1-2 puffs into the lungs every 6 (six) hours as needed for wheezing or shortness of breath. 03/02/20   Becky Augusta, NP  Spacer/Aero-Holding Deretha Emory (AEROCHAMBER MV) inhaler Use as instructed 03/02/20   Becky Augusta, NP                                                                                                                                    Past Surgical History History reviewed. No pertinent surgical history. Family History Family History  Family history unknown: Yes    Social History Social History   Tobacco Use   Smoking status: Never   Smokeless tobacco: Never  Substance Use Topics   Alcohol use: Not Currently   Drug use: Yes    Types: Marijuana   Allergies Prednisone  Review of Systems Review of Systems  Respiratory:  Negative for shortness of breath.   Cardiovascular:  Negative for chest pain.  Gastrointestinal:  Negative for abdominal pain.  Neurological:  Negative for headaches.  All other systems are  reviewed and are negative for acute change except as noted in the HPI  Physical Exam Vital Signs  I have reviewed the triage vital signs BP 131/72 (BP Location: Right Arm)   Pulse (!) 55   Temp 98.3 F (36.8 C) (Oral)   Resp 15   Ht 5\' 6"  (1.676 m)   Wt 63.5 kg   SpO2 100%   BMI 22.60 kg/m   Physical Exam Vitals reviewed.  Constitutional:      General: She is not in acute distress.    Appearance: She is well-developed. She is not diaphoretic.  HENT:     Head: Normocephalic and atraumatic.     Right Ear: External ear normal.     Left Ear: External ear normal.     Nose: Nose normal.  Eyes:     General: No scleral icterus.    Conjunctiva/sclera: Conjunctivae normal.  Neck:     Trachea: Phonation normal.  Cardiovascular:     Rate and Rhythm: Normal rate and regular rhythm.  Pulmonary:     Effort: Pulmonary effort is normal. No respiratory distress.     Breath sounds: No stridor.  Abdominal:     General: There is no distension.  Musculoskeletal:        General: Normal range of motion.     Right hand: Tenderness present. No swelling, deformity or bony tenderness. Normal strength. Normal sensation. Normal pulse.       Hands:     Cervical back: Normal range of motion.  Neurological:     Mental Status: She is alert and oriented to person, place, and time.  Psychiatric:        Behavior: Behavior normal.    ED Results and Treatments Labs (all labs ordered are listed, but only abnormal results are displayed) Labs Reviewed - No data to display                                                                                                                       EKG  EKG Interpretation  Date/Time:    Ventricular Rate:    PR Interval:    QRS Duration:   QT Interval:    QTC Calculation:   R Axis:     Text Interpretation:         Radiology No results found.  Pertinent labs & imaging results that were available during my care of the patient were reviewed by me and  considered in my medical decision making (see MDM for details).  Medications Ordered in ED Medications  acetaminophen (TYLENOL) tablet 1,000 mg (1,000 mg Oral Given 10/06/20 0604)                                                                                                                                     Procedures .Splint Application  Date/Time: 10/06/2020 6:25 AM Performed by: Nira Conn, MD Authorized by: Nira Conn, MD   Consent:    Consent obtained:  Verbal   Consent given by:  Patient   Alternatives discussed:  No treatment Universal protocol:    Patient identity confirmed:  Verbally with patient and arm band Pre-procedure details:    Distal neurologic exam:  Normal Procedure details:    Location:  Finger   Finger location:  R ring finger   Splint type:  Finger   Supplies:  Aluminum  splint   Attestation: Splint applied and adjusted personally by me   Post-procedure details:    Distal neurologic exam:  Normal   Procedure completion:  Tolerated well, no immediate complications  (including critical care time)  Medical Decision Making / ED Course I have reviewed the nursing notes for this encounter and the patient's prior records (if available in EHR or on provided paperwork).  Alyssa Roberts was evaluated in Emergency Department on 10/06/2020 for the symptoms described in the history of present illness. She was evaluated in the context of the global COVID-19 pandemic, which necessitated consideration that the patient might be at risk for infection with the SARS-CoV-2 virus that causes COVID-19. Institutional protocols and algorithms that pertain to the evaluation of patients at risk for COVID-19 are in a state of rapid change based on information released by regulatory bodies including the CDC and federal and state organizations. These policies and algorithms were followed during the patient's care in the ED.     Tendonitis vs strain. No trauma  concerning for fracture/dislocation. Not consistent with infectious process. Splinted for comfort. Tylenol PO for pain. RICE recommended.   Final Clinical Impression(s) / ED Diagnoses Final diagnoses:  Sprain of metacarpophalangeal (MCP) joint of right ring finger, initial encounter   The patient appears reasonably screened and/or stabilized for discharge and I doubt any other medical condition or other [facility] requiring further screening, evaluation, or treatment in the ED at this time prior to discharge. Safe for discharge with strict return precautions.  Disposition: Discharge  Condition: Good  I have discussed the results, Dx and Tx plan with the patient/family who expressed understanding and agree(s) with the plan. Discharge instructions discussed at length. The patient/family was given strict return precautions who verbalized understanding of the instructions. No further questions at time of discharge.    ED Discharge Orders     None        Follow Up: Primary care provider  Schedule an appointment as soon as possible for a visit  if you do not have a primary care physician, contact [facility] at [phone] for referral    This chart was dictated using voice recognition software.  Despite best efforts to proofread,  errors can occur which can change the documentation meaning.    Nira Conn, MD 10/06/20 [phone]

## 2020-10-20 ENCOUNTER — Encounter: Payer: 59 | Admitting: Obstetrics and Gynecology

## 2020-10-30 ENCOUNTER — Emergency Department (HOSPITAL_COMMUNITY)
Admission: EM | Admit: 2020-10-30 | Discharge: 2020-10-30 | Disposition: A | Discharge status: Home / Self Care | Payer: 59 | Attending: Emergency Medicine | Admitting: Emergency Medicine

## 2020-10-30 ENCOUNTER — Encounter (HOSPITAL_COMMUNITY): Payer: Self-pay

## 2020-10-30 ENCOUNTER — Emergency Department (HOSPITAL_COMMUNITY): Payer: 59

## 2020-10-30 DIAGNOSIS — Z20822 Contact with and (suspected) exposure to covid-19: Secondary | ICD-10-CM | POA: Diagnosis not present

## 2020-10-30 DIAGNOSIS — J45901 Unspecified asthma with (acute) exacerbation: Secondary | ICD-10-CM | POA: Diagnosis not present

## 2020-10-30 DIAGNOSIS — F17200 Nicotine dependence, unspecified, uncomplicated: Secondary | ICD-10-CM | POA: Diagnosis not present

## 2020-10-30 DIAGNOSIS — R0602 Shortness of breath: Secondary | ICD-10-CM | POA: Diagnosis present

## 2020-10-30 LAB — RESP PANEL BY RT-PCR (FLU A&B, COVID) ARPGX2
Influenza A by PCR: NEGATIVE
Influenza B by PCR: NEGATIVE
SARS Coronavirus 2 by RT PCR: NEGATIVE

## 2020-10-30 MED ORDER — AEROCHAMBER Z-STAT PLUS/MEDIUM MISC
1.0000 | Freq: Once | Status: AC
  Administered 2020-10-30: 1
  Filled 2020-10-30: qty 1

## 2020-10-30 MED ORDER — IPRATROPIUM-ALBUTEROL 0.5-2.5 (3) MG/3ML IN SOLN
3.0000 mL | Freq: Once | RESPIRATORY_TRACT | Status: AC
  Administered 2020-10-30: 3 mL via RESPIRATORY_TRACT
  Filled 2020-10-30: qty 3

## 2020-10-30 MED ORDER — MOMETASONE FURO-FORMOTEROL FUM 100-5 MCG/ACT IN AERO
2.0000 | INHALATION_SPRAY | Freq: Two times a day (BID) | RESPIRATORY_TRACT | Status: DC
  Administered 2020-10-30: 2 via RESPIRATORY_TRACT
  Filled 2020-10-30: qty 8.8

## 2020-10-30 MED ORDER — AEROCHAMBER PLUS FLO-VU SMALL MISC: 1.0000 | Freq: Once | Status: DC

## 2020-10-30 NOTE — ED Triage Notes (Signed)
Pt complains of being short of breath with tonight being worse, temp relief with her inhaler

## 2020-10-30 NOTE — Discharge Instructions (Addendum)
Thank you for allowing me to care for you today in the Emergency Department.   As we discussed, your COVID-19 test is pending.  You can view the results in the MyChart app.  Follow the instructions to download the app and create an account.  I have given you a new inhaler today.  Elwin Sleight is a "maintenance" inhaler that should help you from having to use your albuterol inhaler so frequently.  Use 2 puffs morning and night with the spacer.  You can continue to use the albuterol inhaler with the spacer that we provided you as needed acutely for coughing episodes or shortness of breath.  Follow-up with your primary care provider for recheck of your symptoms.  If the [facility] seems to be helping you control your symptoms and using your home albuterol inhaler less than your primary care provider can discuss medication refills with you.  Consider cutting back on your home tobacco use.  Even cutting back by 1 cigarette a week can help prevent your risk of lung cancer.  Return to the emergency department if you pass out, if you develop respiratory distress, severe shortness of breath and chest pain, or other new, concerning symptoms.

## 2020-10-30 NOTE — ED Provider Notes (Signed)
[facility]-EMERGENCY DEPT Provider Note   CSN: [id number] Arrival date & time: 10/30/20  0435     History No chief complaint on file.   Alyssa Roberts is a 30 y.o. female who presents emergency department with a chief complaint of shortness of breath.  The patient reports that she has felt more short of breath for the last month.  Her symptoms worsened tonight and she was unable to sleep due to persistent coughing.  She is also noted some whistling noises with breathing.  She has an albuterol inhaler that is using multiple times daily.  She will have intermittent improvement in her symptoms after using the albuterol, but they will return.  She also reports that she has been feeling a burning sensation of chest tightness.  No known aggravating or alleviating factors.  No fever, chills, sore throat, leg swelling, chest pain, abdominal pain, nausea, vomiting, diarrhea, rash, or nasal congestion.  No other treatment prior to arrival.  She reports that she works in a [employer].  She has never been formally diagnosed with asthma.  She is a current, every day smoker.  The history is provided by the patient and medical records. No language interpreter was used.      Past Medical History:  Diagnosis Date   Asthma     There are no problems to display for this patient.   History reviewed. No pertinent surgical history.   OB History   No obstetric history on file.     Family History  Family history unknown: Yes    Social History   Tobacco Use   Smoking status: Never   Smokeless tobacco: Never  Substance Use Topics   Alcohol use: Not Currently   Drug use: Yes    Types: Marijuana    Home Medications Prior to Admission medications   Medication Sig Start Date End Date Taking? Authorizing Provider  albuterol (VENTOLIN HFA) 108 (90 Base) MCG/ACT inhaler Inhale 1-2 puffs into the lungs every 6 (six) hours as needed for wheezing or shortness of breath. 03/02/20    Becky Augusta, NP  Spacer/Aero-Holding Deretha Emory (AEROCHAMBER MV) inhaler Use as instructed 03/02/20   Becky Augusta, NP    Allergies    Prednisone  Review of Systems   Review of Systems  Constitutional:  Negative for activity change, chills, diaphoresis and fever.  HENT:  Negative for congestion and sore throat.   Respiratory:  Positive for cough, chest tightness, shortness of breath and wheezing.   Cardiovascular:  Negative for chest pain and palpitations.  Gastrointestinal:  Negative for abdominal pain, constipation, diarrhea, nausea and vomiting.  Genitourinary:  Negative for dysuria.  Musculoskeletal:  Negative for back pain, neck pain and neck stiffness.  Skin:  Negative for rash.  Allergic/Immunologic: Negative for immunocompromised state.  Neurological:  Negative for seizures, syncope, weakness, numbness and headaches.  Psychiatric/Behavioral:  Negative for confusion.    Physical Exam Updated Vital Signs BP 133/78 (BP Location: Right Arm)   Pulse (!) 59   Temp 99.2 F (37.3 C) (Oral)   Resp 14   Ht 5\' 6"  (1.676 m)   Wt 68 kg   LMP 09/29/2020   SpO2 100%   BMI 24.21 kg/m   Physical Exam Vitals and nursing note reviewed.  Constitutional:      General: She is not in acute distress.    Appearance: She is not ill-appearing, toxic-appearing or diaphoretic.  HENT:     Head: Normocephalic.     Nose: Nose  normal. No congestion.     Mouth/Throat:     Mouth: Mucous membranes are moist.     Pharynx: No oropharyngeal exudate or posterior oropharyngeal erythema.  Eyes:     Conjunctiva/sclera: Conjunctivae normal.  Cardiovascular:     Rate and Rhythm: Normal rate and regular rhythm.     Heart sounds: No murmur heard.   No friction rub. No gallop.  Pulmonary:     Effort: Pulmonary effort is normal. No respiratory distress.     Breath sounds: No stridor. Wheezing present. No rhonchi or rales.     Comments: Good air movement throughout.  She has end expiratory wheezes  diffusely.  No rhonchi or rales.  He was speaking complete, fluent sentences without increased work of breathing. Abdominal:     General: There is no distension.     Palpations: Abdomen is soft. There is no mass.     Tenderness: There is no abdominal tenderness. There is no right CVA tenderness, left CVA tenderness, guarding or rebound.     Hernia: No hernia is present.  Musculoskeletal:     Cervical back: Neck supple.  Skin:    General: Skin is warm.     Findings: No rash.  Neurological:     Mental Status: She is alert.  Psychiatric:        Behavior: Behavior normal.    ED Results / Procedures / Treatments   Labs (all labs ordered are listed, but only abnormal results are displayed) Labs Reviewed  RESP PANEL BY RT-PCR (FLU A&B, COVID) [id number]    EKG None  Radiology DG Chest 2 View  Result Date: 10/30/2020 CLINICAL DATA:  Shortness of breath. EXAM: CHEST - 2 VIEW COMPARISON:  03/02/2020 FINDINGS: The heart size and mediastinal contours are within normal limits. Both lungs are clear. The visualized skeletal structures are unremarkable. IMPRESSION: No active cardiopulmonary disease. Electronically Signed   By: Kennith Center M.D.   On: 10/30/2020 06:17    Procedures Procedures   Medications Ordered in ED Medications  mometasone-formoterol (DULERA) 100-5 MCG/ACT inhaler 2 puff (has no administration in time range)  aerochamber Z-Stat Plus/medium 1 each (1 each Other Given 10/30/20 0615)  ipratropium-albuterol (DUONEB) 0.5-2.5 (3) MG/3ML nebulizer solution 3 mL (3 mLs Nebulization Given 10/30/20 9798)    ED Course  I have reviewed the triage vital signs and the nursing notes.  Pertinent labs & imaging results that were available during my care of the patient were reviewed by me and considered in my medical decision making (see chart for details).    MDM Rules/Calculators/A&P                           30 year old female presenting with shortness of breath for the last month.   Symptoms worsened tonight and she was unable to sleep due to persistent coughing when she laid down.  She has also been wheezing and chest tightness, but denies any other associated symptoms.  Symptoms do improve with albuterol use temporarily.  Vital signs are stable.  No constitutional symptoms.  On exam, she has good air movement throughout, but does have end expiratory wheezes diffusely.  Imaging has been reviewed and independently interpreted by me.  Chest x-ray is unremarkable.  She was given a DuoNeb and on reevaluation lungs are clear to auscultation.  Given that she has been using albuterol inhaler frequently, will trial the patient on Dulera as a maintenance inhaler to try to cut back on  usage.  Patient was also counseled on smoking cessation.  She is in agreement with work-up and plan.  She will follow-up with her primary care provider for reevaluation.  She is also requesting a COVID-19 test for work, which is pending.  Doubt ACS, CHF exacerbation, PE, aortic dissection, peptic ulcer disease, gastritis, pancreatitis.  ER return precautions given.  She is hemodynamically stable in no acute distress.  Safer discharge home with outpatient follow-up as discussed.  Final Clinical Impression(s) / ED Diagnoses Final diagnoses:  Reactive airway disease with acute exacerbation, unspecified asthma severity, unspecified whether persistent    Rx / DC Orders ED Discharge Orders     None        Barkley Boards, PA-C 10/30/20 0640    Zadie Rhine, MD 10/30/20 [phone]

## 2020-10-30 NOTE — ED Provider Notes (Signed)
Emergency Medicine Provider Triage Evaluation Note  Alyssa Roberts , a 30 y.o. female  was evaluated in triage.  Pt complains of shortness of breath. She has been feeling short of breath for the last month, but tonight she was unable to sleep due to coughing persistently. She works in a [employer] and feels as if she has been using her albuterol inhaler constantly. She will have intermittent improvement with her systems with albuterol use. She reports her chest also has been "burning" pain and tightness. No fever, chills, vomiting, leg swelling. She has never been formally diagnosed with asthma.   Review of Systems  Positive: Shortness of breath, cough, chest tightness Negative: Fever, chills, leg swelling, vomiting  Physical Exam  BP 133/78 (BP Location: Right Arm)   Pulse (!) 59   Temp 99.2 F (37.3 C) (Oral)   Resp 14   Ht 5\' 6"  (1.676 m)   Wt 68 kg   SpO2 100%   BMI 24.21 kg/m  Gen:   Awake, no distress   Resp:  Normal effort, end expiratory wheezes, coughing with deep inspiration  MSK:   Moves extremities without difficulty  Other:  No leg swelling  Medical Decision Making  Medically screening exam initiated at 5:47 AM.  Appropriate orders placed.  Teria V Eads was informed that the remainder of the evaluation will be completed by another provider, this initial triage assessment does not replace that evaluation, and the importance of remaining in the ED until their evaluation is complete.  Will order duoneb and reassess. Given length of symptom onset, will order CXR. She will require further work up and evaluation in the ED.   Dan Humphreys A, PA-C 10/30/20 12/30/20    [id number], MD 10/30/20 [phone]

## 2020-11-24 DIAGNOSIS — J302 Other seasonal allergic rhinitis: Secondary | ICD-10-CM | POA: Diagnosis not present

## 2020-11-24 DIAGNOSIS — J4520 Mild intermittent asthma, uncomplicated: Secondary | ICD-10-CM | POA: Diagnosis not present

## 2020-12-01 DIAGNOSIS — J302 Other seasonal allergic rhinitis: Secondary | ICD-10-CM | POA: Diagnosis not present

## 2020-12-01 DIAGNOSIS — J4520 Mild intermittent asthma, uncomplicated: Secondary | ICD-10-CM | POA: Diagnosis not present

## 2020-12-09 ENCOUNTER — Encounter (HOSPITAL_COMMUNITY): Payer: Self-pay | Admitting: Emergency Medicine

## 2020-12-09 ENCOUNTER — Ambulatory Visit (HOSPITAL_COMMUNITY)
Admission: EM | Admit: 2020-12-09 | Discharge: 2020-12-09 | Disposition: A | Discharge status: Home / Self Care | Payer: BC Managed Care – PPO | Attending: Emergency Medicine | Admitting: Emergency Medicine

## 2020-12-09 ENCOUNTER — Other Ambulatory Visit: Payer: Self-pay

## 2020-12-09 DIAGNOSIS — J029 Acute pharyngitis, unspecified: Secondary | ICD-10-CM | POA: Diagnosis not present

## 2020-12-09 DIAGNOSIS — Z20822 Contact with and (suspected) exposure to covid-19: Secondary | ICD-10-CM | POA: Insufficient documentation

## 2020-12-09 NOTE — ED Provider Notes (Signed)
[facility]    CSN: [id number] Arrival date & time: 12/09/20  1229      History   Chief Complaint Chief Complaint  Patient presents with   Cough   Hoarse    HPI Alyssa Roberts is a 30 y.o. female.   Patient here for evaluation of sinus drainage, cough, and hoarse voice that has been ongoing for the past several days.  Patient does report history of asthma as well as sinus issues/seasonal allergies.  Reports that she was sent home from work yesterday and that she needs a COVID test prior to returning.  Denies any trauma, injury, or other precipitating event.  Denies any specific alleviating or aggravating factors.  Denies any fevers, chest pain, shortness of breath, N/V/D, numbness, tingling, weakness, abdominal pain, or headaches.    The history is provided by the patient.  Cough  Past Medical History:  Diagnosis Date   Asthma     There are no problems to display for this patient.   History reviewed. No pertinent surgical history.  OB History   No obstetric history on file.      Home Medications    Prior to Admission medications   Medication Sig Start Date End Date Taking? Authorizing Provider  albuterol (VENTOLIN HFA) 108 (90 Base) MCG/ACT inhaler Inhale 1-2 puffs into the lungs every 6 (six) hours as needed for wheezing or shortness of breath. 03/02/20   Becky Augusta, NP  Spacer/Aero-Holding Deretha Emory (AEROCHAMBER MV) inhaler Use as instructed 03/02/20   Becky Augusta, NP    Family History Family History  Family history unknown: Yes    Social History Social History   Tobacco Use   Smoking status: Never   Smokeless tobacco: Never  Substance Use Topics   Alcohol use: Not Currently   Drug use: Yes    Types: Marijuana     Allergies   Prednisone and Tramadol   Review of Systems Review of Systems  Respiratory:  Positive for cough.   All other systems reviewed and are negative.   Physical Exam Triage Vital Signs ED Triage Vitals  Enc  Vitals Group     BP 12/09/20 1355 (!) 150/95     Pulse Rate 12/09/20 1355 73     Resp 12/09/20 1355 18     Temp 12/09/20 1355 98.5 F (36.9 C)     Temp Source 12/09/20 1355 Oral     SpO2 12/09/20 1355 99 %     Weight --      Height --      Head Circumference --      Peak Flow --      Pain Score 12/09/20 1353 5     Pain Loc --      Pain Edu? --      Excl. in GC? --    No data found.  Updated Vital Signs BP (!) 150/95 (BP Location: Right Arm)   Pulse 73   Temp 98.5 F (36.9 C) (Oral)   Resp 18   LMP 12/01/2020   SpO2 99%   Visual Acuity Right Eye Distance:   Left Eye Distance:   Bilateral Distance:    Right Eye Near:   Left Eye Near:    Bilateral Near:     Physical Exam Vitals and nursing note reviewed.  Constitutional:      General: She is not in acute distress.    Appearance: Normal appearance. She is not ill-appearing, toxic-appearing or diaphoretic.  HENT:  Head: Normocephalic and atraumatic.     Nose: Congestion present.     Right Turbinates: Swollen.     Left Turbinates: Swollen.     Mouth/Throat:     Pharynx: Posterior oropharyngeal erythema present. No oropharyngeal exudate.     Tonsils: No tonsillar exudate or tonsillar abscesses. 0 on the right. 0 on the left.  Eyes:     Conjunctiva/sclera: Conjunctivae normal.  Cardiovascular:     Rate and Rhythm: Normal rate.     Pulses: Normal pulses.     Heart sounds: Normal heart sounds.  Pulmonary:     Effort: Pulmonary effort is normal.     Breath sounds: Normal breath sounds.  Abdominal:     General: Abdomen is flat.  Musculoskeletal:        General: Normal range of motion.     Cervical back: Normal range of motion.  Skin:    General: Skin is warm and dry.  Neurological:     General: No focal deficit present.     Mental Status: She is alert and oriented to person, place, and time.  Psychiatric:        Mood and Affect: Mood normal.     UC Treatments / Results  Labs (all labs ordered are  listed, but only abnormal results are displayed) Labs Reviewed  SARS CORONAVIRUS 2 (TAT 6-24 HRS)    EKG   Radiology No results found.  Procedures Procedures (including critical care time)  Medications Ordered in UC Medications - No data to display  Initial Impression / Assessment and Plan / UC Course  I have reviewed the triage vital signs and the nursing notes.  Pertinent labs & imaging results that were available during my care of the patient were reviewed by me and considered in my medical decision making (see chart for details).    Assessment negative for red flags or concerns.  Likely viral pharyngitis versus seasonal allergies.  COVID test pending.  Tylenol and or ibuprofen as needed.  Encourage fluids and rest.  Discussed conservative symptom management as described in discharge instructions.  Follow-up with primary care for re-evaluation as needed.  Final Clinical Impressions(s) / UC Diagnoses   Final diagnoses:  Viral pharyngitis     Discharge Instructions      You can take Tylenol and/or Ibuprofen as needed for fever reduction and pain relief.   For cough: honey 1/2 to 1 teaspoon (you can dilute the honey in water or another fluid).  You can also use guaifenesin and dextromethorphan for cough. You can use a humidifier for chest congestion and cough.  If you don't have a humidifier, you can sit in the bathroom with the hot shower running.     For sore throat: try warm salt water gargles, cepacol lozenges, throat spray, warm tea or water with lemon/honey, popsicles or ice, or OTC cold relief medicine for throat discomfort.    For congestion: take a daily anti-histamine like Zyrtec, Claritin, and a oral decongestant, such as pseudoephedrine.  You can also use Flonase 1-2 sprays in each nostril daily.    It is important to stay hydrated: drink plenty of fluids (water, gatorade/powerade/pedialyte, juices, or teas) to keep your throat moisturized and help further  relieve irritation/discomfort.   Return or go to the Emergency Department if symptoms worsen or do not improve in the next few days.      ED Prescriptions   None    PDMP not reviewed this encounter.   Ivette Loyal, NP  12/09/20 1415  

## 2020-12-09 NOTE — Discharge Instructions (Signed)

## 2020-12-09 NOTE — ED Triage Notes (Signed)
Woke up yesterday with cough hoarse voice and sinus drainage. Reports was out of work and now need covid test before can return.

## 2020-12-10 LAB — SARS CORONAVIRUS 2 (TAT 6-24 HRS): SARS Coronavirus 2: NEGATIVE

## 2021-01-29 ENCOUNTER — Emergency Department (HOSPITAL_COMMUNITY)
Admission: EM | Admit: 2021-01-29 | Discharge: 2021-01-29 | Disposition: A | Discharge status: Home / Self Care | Payer: BC Managed Care – PPO | Source: Home / Self Care

## 2021-05-13 ENCOUNTER — Other Ambulatory Visit: Payer: Self-pay

## 2021-05-13 ENCOUNTER — Emergency Department (HOSPITAL_BASED_OUTPATIENT_CLINIC_OR_DEPARTMENT_OTHER)
Admission: EM | Admit: 2021-05-13 | Discharge: 2021-05-14 | Disposition: A | Discharge status: Home / Self Care | Payer: BC Managed Care – PPO | Attending: Emergency Medicine | Admitting: Emergency Medicine

## 2021-05-13 ENCOUNTER — Encounter (HOSPITAL_BASED_OUTPATIENT_CLINIC_OR_DEPARTMENT_OTHER): Payer: Self-pay | Admitting: Urology

## 2021-05-13 ENCOUNTER — Emergency Department (HOSPITAL_BASED_OUTPATIENT_CLINIC_OR_DEPARTMENT_OTHER): Payer: BC Managed Care – PPO

## 2021-05-13 DIAGNOSIS — S0990XA Unspecified injury of head, initial encounter: Secondary | ICD-10-CM | POA: Insufficient documentation

## 2021-05-13 DIAGNOSIS — S199XXA Unspecified injury of neck, initial encounter: Secondary | ICD-10-CM | POA: Insufficient documentation

## 2021-05-13 DIAGNOSIS — S0003XA Contusion of scalp, initial encounter: Secondary | ICD-10-CM | POA: Diagnosis not present

## 2021-05-13 MED ORDER — ACETAMINOPHEN 500 MG PO TABS
1000.0000 mg | ORAL_TABLET | Freq: Once | ORAL | Status: AC
  Administered 2021-05-13: 1000 mg via ORAL
  Filled 2021-05-13: qty 2
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
## 2021-05-13 NOTE — ED Notes (Signed)
Pt does not wear glasses or contacts 

## 2021-05-26 DIAGNOSIS — L700 Acne vulgaris: Secondary | ICD-10-CM | POA: Diagnosis not present

## 2021-07-07 DIAGNOSIS — L700 Acne vulgaris: Secondary | ICD-10-CM | POA: Diagnosis not present

## 2021-09-21 ENCOUNTER — Emergency Department (HOSPITAL_BASED_OUTPATIENT_CLINIC_OR_DEPARTMENT_OTHER): Payer: BC Managed Care – PPO

## 2021-09-21 ENCOUNTER — Encounter (HOSPITAL_BASED_OUTPATIENT_CLINIC_OR_DEPARTMENT_OTHER): Payer: Self-pay | Admitting: Emergency Medicine

## 2021-09-21 ENCOUNTER — Emergency Department (HOSPITAL_BASED_OUTPATIENT_CLINIC_OR_DEPARTMENT_OTHER)
Admission: EM | Admit: 2021-09-21 | Discharge: 2021-09-21 | Disposition: A | Discharge status: Home / Self Care | Payer: BC Managed Care – PPO | Attending: Emergency Medicine | Admitting: Emergency Medicine

## 2021-09-21 ENCOUNTER — Other Ambulatory Visit: Payer: Self-pay

## 2021-09-21 DIAGNOSIS — Y990 Civilian activity done for income or pay: Secondary | ICD-10-CM | POA: Diagnosis not present

## 2021-09-21 DIAGNOSIS — W19XXXA Unspecified fall, initial encounter: Secondary | ICD-10-CM | POA: Diagnosis not present

## 2021-09-21 DIAGNOSIS — S93402A Sprain of unspecified ligament of left ankle, initial encounter: Secondary | ICD-10-CM | POA: Insufficient documentation

## 2021-09-21 DIAGNOSIS — S99912A Unspecified injury of left ankle, initial encounter: Secondary | ICD-10-CM | POA: Diagnosis not present

## 2021-09-21 DIAGNOSIS — M25572 Pain in left ankle and joints of left foot: Secondary | ICD-10-CM | POA: Diagnosis not present

## 2021-09-21 MED ORDER — ACETAMINOPHEN 500 MG PO TABS
1000.0000 mg | ORAL_TABLET | Freq: Once | ORAL | Status: AC
  Administered 2021-09-21: 1000 mg via ORAL
  Filled 2021-09-21: qty 2

## 2021-09-21 MED ORDER — IBUPROFEN 800 MG PO TABS
800.0000 mg | ORAL_TABLET | Freq: Once | ORAL | Status: AC
  Administered 2021-09-21: 800 mg via ORAL
  Filled 2021-09-21: qty 1

## 2021-09-21 MED ORDER — NAPROXEN 500 MG PO TABS: 500.0000 mg | ORAL_TABLET | Freq: Two times a day (BID) | ORAL | 0 refills | Status: DC

## 2021-09-21 NOTE — ED Notes (Signed)
Pt discharged to home. Discharge instructions have been discussed with patient and/or family members. Pt verbally acknowledges understanding d/c instructions, and endorses comprehension to checkout at registration before leaving.  °

## 2021-09-21 NOTE — ED Triage Notes (Signed)
Fell at work tonight, L ankle pain since, difficulty bearing weight, no deformity.

## 2021-09-21 NOTE — ED Provider Notes (Signed)
[facility] EM[facility]PART[facility] Provider Note   CSN: [id number] Arrival date & time: 09/21/21  9242     History  Chief Complaint  Patient presents with   Ankle Injury    Alyssa Roberts is a 31 y.o. female.  The history is provided by the patient.  Ankle Injury This is a new problem. The current episode started 3 to 5 hours ago. The problem occurs constantly. The problem has not changed since onset.Pertinent negatives include no chest pain, no abdominal pain, no headaches and no shortness of breath. Nothing aggravates the symptoms. Nothing relieves the symptoms. She has tried nothing for the symptoms. The treatment provided no relief.       Home Medications Prior to Admission medications   Medication Sig Start Date End Date Taking? Authorizing Provider  naproxen (NAPROSYN) 500 MG tablet Take 1 tablet (500 mg total) by mouth 2 (two) times daily with a meal. 09/21/21  Yes Eriko Economos, MD  albuterol (VENTOLIN HFA) 108 (90 Base) MCG/ACT inhaler Inhale 1-2 puffs into the lungs every 6 (six) hours as needed for wheezing or shortness of breath. 03/02/20   Becky Augusta, NP  Spacer/Aero-Holding Deretha Emory (AEROCHAMBER MV) inhaler Use as instructed 03/02/20   Becky Augusta, NP      Allergies    Prednisone and Tramadol    Review of Systems   Review of Systems  Constitutional:  Negative for fever.  HENT:  Negative for congestion and facial swelling.   Eyes:  Negative for photophobia.  Respiratory:  Negative for shortness of breath.   Cardiovascular:  Negative for chest pain.  Gastrointestinal:  Negative for abdominal pain.  Genitourinary:  Negative for difficulty urinating.  Neurological:  Negative for headaches.  All other systems reviewed and are negative.   Physical Exam Updated Vital Signs Ht 5\' 6"  (1.676 m)   Wt 61.2 kg   LMP 09/14/2021   BMI 21.79 kg/m  Physical Exam Vitals and nursing note reviewed.  Constitutional:      General: She is not in acute  distress.    Appearance: Normal appearance. She is well-developed.  HENT:     Head: Normocephalic and atraumatic.     Nose: Nose normal.  Eyes:     Pupils: Pupils are equal, round, and reactive to light.  Cardiovascular:     Rate and Rhythm: Normal rate and regular rhythm.     Pulses: Normal pulses.     Heart sounds: Normal heart sounds.  Pulmonary:     Effort: Pulmonary effort is normal. No respiratory distress.     Breath sounds: Normal breath sounds.  Abdominal:     General: Bowel sounds are normal. There is no distension.     Palpations: Abdomen is soft.     Tenderness: There is no abdominal tenderness. There is no guarding or rebound.  Genitourinary:    Vagina: No vaginal discharge.  Musculoskeletal:        General: No swelling or deformity. Normal range of motion.     Cervical back: Neck supple.     Left ankle: No swelling, deformity, ecchymosis or lacerations. Normal range of motion. Anterior drawer test negative. Normal pulse.     Left Achilles Tendon: Normal.     Right foot: Normal.     Left foot: Normal.  Skin:    General: Skin is warm and dry.     Capillary Refill: Capillary refill takes less than 2 seconds.     Findings: No erythema or rash.  Neurological:  General: No focal deficit present.     Mental Status: She is alert and oriented to person, place, and time.     Deep Tendon Reflexes: Reflexes normal.  Psychiatric:        Mood and Affect: Mood normal.     ED Results / Procedures / Treatments   Labs (all labs ordered are listed, but only abnormal results are displayed) Labs Reviewed - No data to display  EKG None  Radiology DG Ankle Complete Left  Result Date: 09/21/2021 CLINICAL DATA:  Fall with left ankle pain. EXAM: LEFT ANKLE COMPLETE - 3+ VIEW COMPARISON:  02/09/2005. FINDINGS: There is no evidence of fracture, dislocation, or joint effusion. Osteophytes are present along the dorsal aspect of the talus and navicular bones. Soft tissues are  unremarkable. IMPRESSION: No acute fracture or dislocation. Electronically Signed   By: Thornell Sartorius M.D.   On: 09/21/2021 04:11    Procedures Procedures    Medications Ordered in ED Medications  acetaminophen (TYLENOL) tablet 1,000 mg (has no administration in time range)  ibuprofen (ADVIL) tablet 800 mg (has no administration in time range)    ED Course/ Medical Decision Making/ A&P                           Medical Decision Making Rolled ankle at 230 am   Amount and/or Complexity of Data Reviewed External Data Reviewed: notes.    Details: previous notes reviewed Radiology: ordered and independent interpretation performed.    Details: negative xray by me  Risk OTC drugs. Prescription drug management. Risk Details: Ice elevation and NSAIDs.  May add tylenol.  Icing instructions given.  Stable for discharge with close follow up    Final Clinical Impression(s) / ED Diagnoses Final diagnoses:  Sprain of left ankle, unspecified ligament, initial encounter   Return for intractable cough, coughing up blood, fevers > 100.4 unrelieved by medication, shortness of breath, intractable vomiting, chest pain, shortness of breath, weakness, numbness, changes in speech, facial asymmetry, abdominal pain, passing out, Inability to tolerate liquids or food, cough, altered mental status or any concerns. No signs of systemic illness or infection. The patient is nontoxic-appearing on exam and vital signs are within normal limits.  I have reviewed the triage vital signs and the nursing notes. Pertinent labs & imaging results that were available during my care of the patient were reviewed by me and considered in my medical decision making (see chart for details). After history, exam, and medical workup I feel the patient has been appropriately medically screened and is safe for discharge home. Pertinent diagnoses were discussed with the patient. Patient was given return precautions.  Rx / DC  Orders ED Discharge Orders          Ordered    naproxen (NAPROSYN) 500 MG tablet  2 times daily with meals        09/21/21 0416              Phyllis Whitefield, MD 09/21/21 0932

## 2021-10-27 DIAGNOSIS — R0789 Other chest pain: Secondary | ICD-10-CM | POA: Diagnosis not present

## 2021-10-27 DIAGNOSIS — R079 Chest pain, unspecified: Secondary | ICD-10-CM | POA: Diagnosis not present

## 2021-10-27 DIAGNOSIS — Z20822 Contact with and (suspected) exposure to covid-19: Secondary | ICD-10-CM | POA: Diagnosis not present

## 2021-12-12 DIAGNOSIS — S93402A Sprain of unspecified ligament of left ankle, initial encounter: Secondary | ICD-10-CM | POA: Diagnosis not present

## 2021-12-13 ENCOUNTER — Other Ambulatory Visit: Payer: Self-pay

## 2021-12-13 ENCOUNTER — Encounter (HOSPITAL_BASED_OUTPATIENT_CLINIC_OR_DEPARTMENT_OTHER): Payer: Self-pay | Admitting: *Deleted

## 2021-12-13 ENCOUNTER — Emergency Department (HOSPITAL_BASED_OUTPATIENT_CLINIC_OR_DEPARTMENT_OTHER): Payer: BC Managed Care – PPO

## 2021-12-13 ENCOUNTER — Emergency Department (HOSPITAL_BASED_OUTPATIENT_CLINIC_OR_DEPARTMENT_OTHER)
Admission: EM | Admit: 2021-12-13 | Discharge: 2021-12-13 | Disposition: A | Discharge status: Home / Self Care | Payer: BC Managed Care – PPO | Attending: Emergency Medicine | Admitting: Emergency Medicine

## 2021-12-13 DIAGNOSIS — M25562 Pain in left knee: Secondary | ICD-10-CM | POA: Insufficient documentation

## 2021-12-13 DIAGNOSIS — S93402A Sprain of unspecified ligament of left ankle, initial encounter: Secondary | ICD-10-CM | POA: Insufficient documentation

## 2021-12-13 DIAGNOSIS — S99912A Unspecified injury of left ankle, initial encounter: Secondary | ICD-10-CM | POA: Diagnosis not present

## 2021-12-13 DIAGNOSIS — M7989 Other specified soft tissue disorders: Secondary | ICD-10-CM | POA: Diagnosis not present

## 2021-12-13 DIAGNOSIS — S8392XA Sprain of unspecified site of left knee, initial encounter: Secondary | ICD-10-CM | POA: Diagnosis not present

## 2021-12-13 DIAGNOSIS — X501XXA Overexertion from prolonged static or awkward postures, initial encounter: Secondary | ICD-10-CM | POA: Diagnosis not present

## 2021-12-13 DIAGNOSIS — J45909 Unspecified asthma, uncomplicated: Secondary | ICD-10-CM | POA: Diagnosis not present

## 2021-12-13 DIAGNOSIS — S8992XA Unspecified injury of left lower leg, initial encounter: Secondary | ICD-10-CM | POA: Diagnosis not present

## 2021-12-13 MED ORDER — MELOXICAM 7.5 MG PO TABS: 7.5000 mg | ORAL_TABLET | Freq: Every day | ORAL | 0 refills | Status: DC

## 2021-12-13 MED ORDER — OXYCODONE-ACETAMINOPHEN 5-325 MG PO TABS
1.0000 | ORAL_TABLET | Freq: Once | ORAL | Status: AC
  Administered 2021-12-13: 1 via ORAL
  Filled 2021-12-13: qty 1

## 2021-12-13 MED ORDER — IBUPROFEN 800 MG PO TABS
800.0000 mg | ORAL_TABLET | Freq: Once | ORAL | Status: AC
  Administered 2021-12-13: 800 mg via ORAL
  Filled 2021-12-13: qty 1

## 2021-12-13 NOTE — ED Provider Notes (Signed)
Emergency Department Provider Note   I have reviewed the triage vital signs and the nursing notes.   HISTORY  Chief Complaint Ankle Pain   HPI Alyssa Roberts is a 31 y.o. female presents to the emergency department for evaluation of left ankle pain after fall.  Patient states this evening she tripped and fell twisting her left ankle and landing on the knee.  She is experiencing pain and swelling in the left ankle mainly on the outside portion of the extremity.  No numbness.  No head injury or loss of consciousness.  No neck or back pain.    Past Medical History:  Diagnosis Date   Asthma     Review of Systems  Constitutional: No fever/chills Cardiovascular: Denies chest pain. Respiratory: Denies shortness of breath. Gastrointestinal: No abdominal pain. Musculoskeletal: Negative for back pain. Positive left ankle and knee pain.  Skin: Negative for rash. Neurological: Negative for numbness.   ____________________________________________   PHYSICAL EXAM:  VITAL SIGNS: ED Triage Vitals  Enc Vitals Group     BP 12/13/21 0018 (!) 129/98     Pulse Rate 12/13/21 0018 61     Resp 12/13/21 0018 16     Temp 12/13/21 0018 98.7 F (37.1 C)     Temp Source 12/13/21 0018 Oral     SpO2 12/13/21 0018 100 %   Constitutional: Alert and oriented. Well appearing and in no acute distress. Eyes: Conjunctivae are normal.  Head: Atraumatic. Nose: No congestion/rhinnorhea. Mouth/Throat: Mucous membranes are moist.   Neck: No stridor.   Cardiovascular: Normal rate, regular rhythm. Good peripheral circulation. 2+ DP pulses on the left.  Respiratory: Normal respiratory effort.   Gastrointestinal: No distention.  Musculoskeletal: Soft tissue swelling to the left anterior/lateral aspect of the ankle.  No laceration.  No left knee effusion.  No knee laxity.  Neurologic:  Normal speech and language. Normal sensation to the LLE and left foot.  Skin:  Skin is warm, dry and intact. No rash  noted.  ____________________________________________  RADIOLOGY  DG Ankle Complete Left  Result Date: 12/13/2021 CLINICAL DATA:  Recent trip and fall with ankle pain, initial encounter EXAM: LEFT ANKLE COMPLETE - 3+ VIEW COMPARISON:  09/21/2021 FINDINGS: Mild soft tissue swelling is noted laterally. There is suggestion of a small bony fragment along the lateral aspect of the tarsal bones suspicious for avulsion. Persistent osteophytes are noted at the junction of the talus and navicular unchanged from the prior exam. IMPRESSION: Suggestion of mild avulsion along the lateral aspect of the tarsal bones. Associated soft tissue swelling is noted. Electronically Signed   By: Inez Catalina M.D.   On: 12/13/2021 00:43   DG Knee Complete 4 Views Left  Result Date: 12/13/2021 CLINICAL DATA:  Recent trip and fall with knee pain, initial encounter EXAM: LEFT KNEE - COMPLETE 4+ VIEW COMPARISON:  None Available. FINDINGS: No evidence of fracture, dislocation, or joint effusion. No evidence of arthropathy or other focal bone abnormality. Soft tissues are unremarkable. IMPRESSION: No acute abnormality noted. Electronically Signed   By: Inez Catalina M.D.   On: 12/13/2021 00:41    ____________________________________________   PROCEDURES  Procedure(s) performed:   Procedures  None  ____________________________________________   INITIAL IMPRESSION / ASSESSMENT AND PLAN / ED COURSE  Pertinent labs & imaging results that were available during my care of the patient were reviewed by me and considered in my medical decision making (see chart for details).   This patient is Presenting for Evaluation of ankle  pain, which does require a range of treatment options, and is a complaint that involves a moderate risk of morbidity and mortality.  The Differential Diagnoses include fracture, dislocation, sprain, contusion, MSK strain, etc.  Critical Interventions-    Medications  ibuprofen (ADVIL) tablet 800  mg (800 mg Oral Given 12/13/21 0106)  oxyCODONE-acetaminophen (PERCOCET/ROXICET) 5-325 MG per tablet 1 tablet (1 tablet Oral Given 12/13/21 0106)    Reassessment after intervention: Pain improved.   Radiologic Tests Ordered, included ankle and knee x ray. I independently interpreted the images and agree with radiology interpretation.   Medical Decision Making: Summary:  Patient presents emergency department with left ankle and knee pain with left ankle swelling.  No evidence of open fracture.  Avulsion fragment noted on x-ray.  Ankle mortise is stable and without fracture or dislocation.  No findings on the x-ray.  Normal pulses and sensation.  Plan for CAM [place] boot, crutches, and sports med follow up. RICE advised at home. Patient given single percocet here but has a sober driver at bedside.   Disposition: discharge  ____________________________________________  FINAL CLINICAL IMPRESSION(S) / ED DIAGNOSES  Final diagnoses:  Sprain of left ankle, unspecified ligament, initial encounter  Acute pain of left knee     NEW OUTPATIENT MEDICATIONS STARTED DURING THIS VISIT:  Discharge Medication List as of 12/13/2021 12:58 AM     START taking these medications   Details  meloxicam (MOBIC) 7.5 MG tablet Take 1 tablet (7.5 mg total) by mouth daily., Starting Sun 12/13/2021, Normal        Note:  This document was prepared using Dragon voice recognition software and may include unintentional dictation errors.  Nanda Quinton, MD, [facility]    Shaquanta Harkless, Wonda Olds, MD 12/13/21 [phone]

## 2021-12-13 NOTE — ED Notes (Signed)
Pt would not sign orth form for crutches and cam boot. She ignored this tech request.

## 2021-12-13 NOTE — Discharge Instructions (Signed)
You are seen in emergency room today with injury to the left ankle.  It appears by x-rays that you have a bad sprain and we have put you in a boot and provided crutches.  Please follow-up with the sports medicine doctor listed.  I have attached information on how to treat a sprain ankle including RICE treatment.  Meloxicam, which I prescribed, is similar to ibuprofen, naproxen, aspirin and he should not take these medicines along with meloxicam.  It is okay to take Tylenol for additional pain relief or to use topical medication such as Voltaren gel for pain management.

## 2021-12-13 NOTE — ED Triage Notes (Signed)
Pt tripped and fell and twisted ankle and knee on left side this pm.

## 2021-12-14 ENCOUNTER — Encounter: Payer: Self-pay | Admitting: Family Medicine

## 2021-12-14 ENCOUNTER — Ambulatory Visit (INDEPENDENT_AMBULATORY_CARE_PROVIDER_SITE_OTHER): Payer: BC Managed Care – PPO | Admitting: Family Medicine

## 2021-12-14 ENCOUNTER — Ambulatory Visit (HOSPITAL_BASED_OUTPATIENT_CLINIC_OR_DEPARTMENT_OTHER)
Admission: RE | Admit: 2021-12-14 | Discharge: 2021-12-14 | Disposition: A | Discharge status: Home / Self Care | Payer: BC Managed Care – PPO | Source: Ambulatory Visit | Attending: Family Medicine | Admitting: Family Medicine

## 2021-12-14 ENCOUNTER — Ambulatory Visit: Payer: Self-pay

## 2021-12-14 VITALS — BP 110/78 | Ht 66.0 in | Wt 135.0 lb

## 2021-12-14 DIAGNOSIS — S92115A Nondisplaced fracture of neck of left talus, initial encounter for closed fracture: Secondary | ICD-10-CM | POA: Insufficient documentation

## 2021-12-14 DIAGNOSIS — S76312A Strain of muscle, fascia and tendon of the posterior muscle group at thigh level, left thigh, initial encounter: Secondary | ICD-10-CM

## 2021-12-14 DIAGNOSIS — M25562 Pain in left knee: Secondary | ICD-10-CM

## 2021-12-14 DIAGNOSIS — M79672 Pain in left foot: Secondary | ICD-10-CM | POA: Diagnosis not present

## 2021-12-14 DIAGNOSIS — T148XXA Other injury of unspecified body region, initial encounter: Secondary | ICD-10-CM | POA: Insufficient documentation

## 2021-12-14 DIAGNOSIS — R600 Localized edema: Secondary | ICD-10-CM | POA: Diagnosis not present

## 2021-12-14 NOTE — Patient Instructions (Signed)
Nice to meet you Please try heat on the knee and ice on the foot  Please try compression on the knee  Please continue the boot for the ankle  I will call with the xray results.  We'll get the MRI at [facility].   Please send me a message in MyChart with any questions or updates.  We will schedule a virtual visit once the MRI resulted..   --Dr. Raeford Razor

## 2021-12-14 NOTE — Assessment & Plan Note (Signed)
Acutely occurring.  Findings most concerning for a talus neck fracture.  Had a trauma a few days ago.  Unable to bear weight with significant tenderness on exam. -Counseled on home exercise therapy and supportive care. -X-ray. -Can continue boot and crutches. -MRI of the left foot and ankle to evaluate for talus neck fracture and presurgical planning. -Provided work note.

## 2021-12-14 NOTE — Progress Notes (Signed)
  Alyssa Roberts - 31 y.o. female MRN [id number]  Date of birth: [birth year]  SUBJECTIVE:  Including CC & ROS.  No chief complaint on file.   Alyssa Roberts is a 31 y.o. female that is presenting with acute left knee and ankle pain.  She [place].  Having severe pain in the midfoot of the foot as well as the lateral posterior aspect of the knee..  Review of the emergency department note from 10/15 shows she was provided ibuprofen and Percocet and discharged with meloxicam. Independent review of the left ankle x-ray from 10/15 shows possible avulsion fracture off of the tarsal bones. Independent review of the left knee x-ray from 10/15 shows no acute changes.  Review of Systems See HPI   HISTORY: Past Medical, Surgical, Social, and Family History Reviewed & Updated per EMR.   Pertinent Historical Findings include:  Past Medical History:  Diagnosis Date   Asthma     History reviewed. No pertinent surgical history.   PHYSICAL EXAM:  VS: BP 110/78 (BP Location: Left Arm, Patient Position: Sitting)   Ht 5\' 6"  (1.676 m)   Wt 135 lb (61.2 kg)   LMP 11/18/2021 (Approximate)   BMI 21.79 kg/m  Physical Exam Gen: NAD, alert, cooperative with exam, well-appearing MSK:  Left foot: Severe tenderness to palpation over the talus. Unable to bear weight without pain. Limited range of motion. Neurovascularly intact    Limited ultrasound: Left foot:  Irregularity appreciated of the neck of the talus with increased hyperemia. No changes of the talar dome. No changes of the distal fibula. Normal-appearing base of the fifth metatarsal.  Summary: Findings concerning for fracture of the talus neck.  Ultrasound and interpretation by Clearance Coots, MD    ASSESSMENT & PLAN:   Hamstring strain, left, initial encounter Acutely occurring after recent injury.  No structural changes appreciated on ultrasound or x-ray.  Has most pain over the distal biceps  femoris. -Counseled on home exercise therapy and supportive care. -Counseled on compression. -Could consider physical therapy.  Closed nondisplaced fracture of neck of left talus Acutely occurring.  Findings most concerning for a talus neck fracture.  Had a trauma a few days ago.  Unable to bear weight with significant tenderness on exam. -Counseled on home exercise therapy and supportive care. -X-ray. -Can continue boot and crutches. -MRI of the left foot and ankle to evaluate for talus neck fracture and presurgical planning. -Provided work note.

## 2021-12-14 NOTE — Assessment & Plan Note (Signed)
Acutely occurring after recent injury.  No structural changes appreciated on ultrasound or x-ray.  Has most pain over the distal biceps femoris. -Counseled on home exercise therapy and supportive care. -Counseled on compression. -Could consider physical therapy.

## 2021-12-16 ENCOUNTER — Telehealth: Payer: Self-pay | Admitting: Family Medicine

## 2021-12-16 NOTE — Telephone Encounter (Signed)
Unable to leave VM for patient. If she calls back please have her speak with a nurse/CMA and inform that her xray doesn't show fracture and does show degenerative changes.   If any questions then please take the best time and phone number to call and I will try to call her back.   Rosemarie Ax, MD [facility] 12/16/2021, 2:40 PM

## 2021-12-29 ENCOUNTER — Ambulatory Visit
Admission: RE | Admit: 2021-12-29 | Discharge: 2021-12-29 | Disposition: A | Discharge status: Home / Self Care | Payer: BC Managed Care – PPO | Source: Ambulatory Visit | Attending: Family Medicine | Admitting: Family Medicine

## 2021-12-29 DIAGNOSIS — M19072 Primary osteoarthritis, left ankle and foot: Secondary | ICD-10-CM | POA: Diagnosis not present

## 2021-12-29 DIAGNOSIS — S9032XA Contusion of left foot, initial encounter: Secondary | ICD-10-CM | POA: Diagnosis not present

## 2021-12-29 DIAGNOSIS — S92115A Nondisplaced fracture of neck of left talus, initial encounter for closed fracture: Secondary | ICD-10-CM

## 2021-12-29 DIAGNOSIS — M65872 Other synovitis and tenosynovitis, left ankle and foot: Secondary | ICD-10-CM | POA: Diagnosis not present

## 2022-01-11 ENCOUNTER — Telehealth (INDEPENDENT_AMBULATORY_CARE_PROVIDER_SITE_OTHER): Payer: BC Managed Care – PPO | Admitting: Family Medicine

## 2022-01-11 DIAGNOSIS — T148XXA Other injury of unspecified body region, initial encounter: Secondary | ICD-10-CM

## 2022-01-11 DIAGNOSIS — M79672 Pain in left foot: Secondary | ICD-10-CM

## 2022-01-11 NOTE — Assessment & Plan Note (Signed)
MRI was demonstrating contusions of the talus after her injury.  Can continue use the cam [place] -Counseled on home exercise therapy and supportive care. -Referral to physical therapy. -Counseled on weaning out of the cam [place]. -Provided work note.

## 2022-01-11 NOTE — Progress Notes (Signed)
Virtual Visit via Video Note  I connected with Alyssa Roberts on 01/11/22 at  1:00 PM EST by a video enabled telemedicine application and verified that I am speaking with the correct person using two identifiers.  Location: Patient: vehicle  Provider: office   I discussed the limitations of evaluation and management by telemedicine and the availability of in person appointments. The patient expressed understanding and agreed to proceed.  History of Present Illness:  Alyssa Roberts is a 31 year old female that is following up after the MRI of her left foot.  This was demonstrating osseous contusions along the plantar aspect of the anterior posterior talus adjacent to the posterior subtalar joint.  She continues to have pain and is unable to bear weight without the cam [place].   Observations/Objective:   Assessment and Plan:  Contusion of the talus: MRI was demonstrating contusions of the talus after her injury.  Can continue use the cam [place] -Counseled on home exercise therapy and supportive care. -Referral to physical therapy. -Counseled on weaning out of the cam [place]. -Provided work note.  Follow Up Instructions:    I discussed the assessment and treatment plan with the patient. The patient was provided an opportunity to ask questions and all were answered. The patient agreed with the plan and demonstrated an understanding of the instructions.   The patient was advised to call back or seek an in-person evaluation if the symptoms worsen or if the condition fails to improve as anticipated.    Clare Gandy, MD

## 2022-01-19 ENCOUNTER — Other Ambulatory Visit: Payer: Self-pay

## 2022-01-19 ENCOUNTER — Ambulatory Visit: Payer: BC Managed Care – PPO | Attending: Family Medicine

## 2022-01-19 DIAGNOSIS — T148XXA Other injury of unspecified body region, initial encounter: Secondary | ICD-10-CM | POA: Diagnosis not present

## 2022-01-19 DIAGNOSIS — M6281 Muscle weakness (generalized): Secondary | ICD-10-CM | POA: Insufficient documentation

## 2022-01-19 DIAGNOSIS — R2681 Unsteadiness on feet: Secondary | ICD-10-CM | POA: Diagnosis not present

## 2022-01-19 DIAGNOSIS — M25572 Pain in left ankle and joints of left foot: Secondary | ICD-10-CM | POA: Diagnosis not present

## 2022-01-19 NOTE — Therapy (Signed)
OUTPATIENT PHYSICAL THERAPY LOWER EXTREMITY EVALUATION   Patient Name: Alyssa Roberts MRN: [id number] DOB:1990/05/15, 31 y.o., female Today's Date: 01/19/2022  END OF SESSION:  PT End of Session - 01/19/22 1433     Visit Number 1    Number of Visits 17    Date for PT Re-Evaluation 03/16/22    Authorization Type BCBS    PT Start Time 1355    PT Stop Time 1430    PT Time Calculation (min) 35 min    Activity Tolerance Patient tolerated treatment well    Behavior During Therapy WFL for tasks assessed/performed             Past Medical History:  Diagnosis Date   Asthma    History reviewed. No pertinent surgical history. Patient Active Problem List   Diagnosis Date Noted   Hamstring strain, left, initial encounter 12/14/2021   Contusion of bone 12/14/2021    PCP: Nolene Ebbs, MD  REFERRING PROVIDER: Rosemarie Ax, MD  REFERRING DIAG: T14.8XXA (ICD-10-CM) - Contusion of bone   THERAPY DIAG:  Pain in left ankle and joints of left foot - Plan: PT plan of care cert/re-cert  Muscle weakness (generalized) - Plan: PT plan of care cert/re-cert  Unsteadiness on feet - Plan: PT plan of care cert/re-cert  Rationale for Evaluation and Treatment: Rehabilitation  ONSET DATE: 12/13/2021  SUBJECTIVE:   SUBJECTIVE STATEMENT: Pt presents to PT s/p L ankle sprain on 12/13/2021. Per MD she has been ambulating in CAM [place] secondary to pain when weight bearing. Recent MRI to L ankle revealed contusions to talus, MD counseled on weaning from CAM. Pt notes that ambulation outside of boot has been getting more comfortable. 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   PERTINENT HISTORY: None PAIN:  Are you having pain?  Yes: NPRS scale: 0/10 Pain location: posterior heel, lateral L ankle Pain description: sharp, stiff Aggravating factors: walking, prolonged standing Relieving factors: elevation  PRECAUTIONS: None  WEIGHT BEARING RESTRICTIONS: Yes  WBAT  FALLS:  Has patient fallen in last 6 months? Yes. Number of falls: one leading to current injury   LIVING ENVIRONMENT: Lives with: lives with their family Lives in: House/apartment Stairs: Yes - to get to apartment Has following equipment at home:  CAM boot  OCCUPATION: [employer]  PLOF: Independent and Independent with basic ADLs  PATIENT GOALS: decrease pain, improve function and   NEXT MD VISIT:   OBJECTIVE:   DIAGNOSTIC FINDINGS:   See imaging for recent MRI of L ankle  PATIENT SURVEYS:  FOTO: 52% function; 76% predicted   COGNITION: Overall cognitive status: Within functional limits for tasks assessed     SENSATION: WFL  EDEMA:  Figure 8: R: 26cm  L: 27cm  POSTURE: No Significant postural limitations  PALPATION: TTP to L lateral ankle  LOWER EXTREMITY ROM:  Active ROM Right eval Left eval  Hip flexion    Hip extension    Hip abduction    Hip adduction    Hip internal rotation    Hip external rotation    Knee flexion    Knee extension    Ankle dorsiflexion WNL -2  Ankle plantarflexion    Ankle inversion    Ankle eversion     (Blank rows = not tested)  LOWER EXTREMITY MMT:  MMT Right eval Left eval  Hip flexion    Hip extension    Hip abduction    Hip adduction    Hip internal  rotation    Hip external rotation    Knee flexion    Knee extension    Ankle dorsiflexion    Ankle plantarflexion    Ankle inversion    Ankle eversion     (Blank rows = not tested)  LOWER EXTREMITY SPECIAL TESTS:  DNT  FUNCTIONAL TESTS:  30 Second Sit to Stand: 8 reps SLS: R - 30 sec; L - unable  GAIT: Distance walked: 46ft Assistive device utilized: None Level of assistance: Modified independence Comments: antalgic gait in CAM L LE   TREATMENT: [facility] Adult PT Treatment:                                                DATE: 01/19/2022 Therapeutic Exercise: Long sitting calf stretch x 30" L Ankle pumps x 20  Seated L ankle inv/ev x 5   Tandem stance L back x 30"  PATIENT EDUCATION:  Education details: eval findings, FOTO, HEP, POC Person educated: Patient Education method: Explanation, Demonstration, and Handouts Education comprehension: verbalized understanding and returned demonstration  HOME EXERCISE PROGRAM: Access Code: [email or web address] URL: [email or web address] Date: 01/19/2022 Prepared by: Octavio Manns  Exercises - Long Sitting Calf Stretch with Strap  - 3 x daily - 7 x weekly - 2 reps - 30 sec hold - Supine Ankle Pumps  - 3 x daily - 7 x weekly - 20 reps - Ankle Inversion Eversion Towel Slide  - 3 x daily - 7 x weekly - 10 reps - Standing Tandem Balance with Counter Support  - 3 x daily - 7 x weekly - 3 sets - 2-3 reps - 30 sec hold  ASSESSMENT:  CLINICAL IMPRESSION: Patient is a 31 y.o. F who was seen today for physical therapy evaluation and treatment s/p L ankle sprain. Physical findings are consistent with injury and recovery timeline, as pt demonstrates decreased L ankle DF ROM and inv/ev motor control. Also continues to have decreased balance in narrow BoS and edema in L lateral ankle. Her FOTO score shows she is operating below PLOF after injury. She would benefit from skilled PT services working on strength, balance, and stability  OBJECTIVE IMPAIRMENTS: Abnormal gait, decreased activity tolerance, decreased balance, decreased endurance, decreased mobility, difficulty walking, decreased ROM, decreased strength, and pain.   ACTIVITY LIMITATIONS: carrying, lifting, standing, squatting, stairs, transfers, and locomotion level  PARTICIPATION LIMITATIONS: driving, shopping, community activity, occupation, and yard work  PERSONAL FACTORS: None  REHAB POTENTIAL: Excellent  CLINICAL DECISION MAKING: Stable/uncomplicated  EVALUATION COMPLEXITY: Low   GOALS: Goals reviewed with patient? No  SHORT TERM GOALS: Target date: 02/09/2022   Pt will be compliant and knowledgeable with initial HEP  for improved comfort and carryover Baseline: initial HEP given  Goal status: INITIAL  2.  Pt will self report left ankle pain no greater than 3/10 for improved comfort and functional ability Baseline: 4/10 at worst Goal status: INITIAL   LONG TERM GOALS: Target date: 03/16/2022   Pt will self report left ankle pain no greater than 1/10 for improved comfort and functional ability Baseline: 4/10 at worst Goal status: INITIAL   2.  Pt will improve FOTO function score to no less than 76% as proxy for functional improvement Baseline: 52% function Goal status: INITIAL   3.  Pt will increase 30 Second Sit to Stand rep count to no less than 13 reps for  improved balance, strength, and functional mobility Baseline: 8 reps  Goal status: INITIAL   4.  Pt will improve L SLS to no less than 30 seconds for improve functional balance and mobility Baseline: unable Goal status: INITIAL  5.  Pt will be able to push/lift 100# with no increase in ankle pain for improved comfort and function with work Baseline: unable Goal status: INITIAL   PLAN:  PT FREQUENCY: 2x/week  PT DURATION: 8 weeks  PLANNED INTERVENTIONS: Therapeutic exercises, Therapeutic activity, Neuromuscular re-education, Balance training, Gait training, Patient/Family education, Self Care, Joint mobilization, Dry Needling, Electrical stimulation, Cryotherapy, Taping, Vasopneumatic device, Manual therapy, and Re-evaluation  PLAN FOR NEXT SESSION: assess HEP response, ankle strengthening and stability   Eloy End, PT 01/19/2022, 2:35 PM

## 2022-01-25 ENCOUNTER — Ambulatory Visit: Payer: BC Managed Care – PPO

## 2022-01-25 DIAGNOSIS — M25572 Pain in left ankle and joints of left foot: Secondary | ICD-10-CM

## 2022-01-25 DIAGNOSIS — T148XXA Other injury of unspecified body region, initial encounter: Secondary | ICD-10-CM | POA: Diagnosis not present

## 2022-01-25 DIAGNOSIS — R2681 Unsteadiness on feet: Secondary | ICD-10-CM

## 2022-01-25 DIAGNOSIS — M6281 Muscle weakness (generalized): Secondary | ICD-10-CM

## 2022-01-25 NOTE — Therapy (Signed)
OUTPATIENT PHYSICAL THERAPY TREATMENT NOTE   Patient Name: Alyssa Roberts MRN: [id number] DOB:17-Feb-1991, 31 y.o., female Today's Date: 01/25/2022  PCP: Fleet Contras, MD  REFERRING PROVIDER: Myra Rude, MD   END OF SESSION:   PT End of Session - 01/25/22 0739     Visit Number 2    Number of Visits 17    Date for PT Re-Evaluation 03/16/22    Authorization Type BCBS    PT Start Time 0745    PT Stop Time 0824    PT Time Calculation (min) 39 min    Activity Tolerance Patient tolerated treatment well    Behavior During Therapy [facility] for tasks assessed/performed             Past Medical History:  Diagnosis Date   Asthma    History reviewed. No pertinent surgical history. Patient Active Problem List   Diagnosis Date Noted   Hamstring strain, left, initial encounter 12/14/2021   Contusion of bone 12/14/2021    REFERRING DIAG: T14.8XXA (ICD-10-CM) - Contusion of bone    THERAPY DIAG:  Pain in left ankle and joints of left foot  Muscle weakness (generalized)  Unsteadiness on feet  Rationale for Evaluation and Treatment Rehabilitation  PERTINENT HISTORY: None   PRECAUTIONS: None   SUBJECTIVE:                                                                                                                                                                                      SUBJECTIVE STATEMENT:  Pt presents to PT with reports of ankle stiffness, no current pain. Has been compliant with HEP and is walking without boot this morning. Pt is ready to begin PT at this time.    PAIN:  Are you having pain?  Yes: NPRS scale: 0/10 Pain location: posterior heel, lateral L ankle Pain description: sharp, stiff Aggravating factors: walking, prolonged standing Relieving factors: elevation   OBJECTIVE: (objective measures completed at initial evaluation unless otherwise dated)  DIAGNOSTIC FINDINGS:             See imaging for recent MRI of L ankle   PATIENT SURVEYS:   FOTO: 52% function; 76% predicted    COGNITION: Overall cognitive status: Within functional limits for tasks assessed                         SENSATION: WFL   EDEMA:  Figure 8: R: 26cm  L: 27cm   POSTURE: No Significant postural limitations   PALPATION: TTP to L lateral ankle   LOWER EXTREMITY ROM:   Active ROM Right eval Left eval  Hip flexion  Hip extension      Hip abduction      Hip adduction      Hip internal rotation      Hip external rotation      Knee flexion      Knee extension      Ankle dorsiflexion WNL -2  Ankle plantarflexion      Ankle inversion      Ankle eversion       (Blank rows = not tested)   LOWER EXTREMITY MMT:   MMT Right eval Left eval  Hip flexion      Hip extension      Hip abduction      Hip adduction      Hip internal rotation      Hip external rotation      Knee flexion      Knee extension      Ankle dorsiflexion      Ankle plantarflexion      Ankle inversion      Ankle eversion       (Blank rows = not tested)   LOWER EXTREMITY SPECIAL TESTS:  DNT   FUNCTIONAL TESTS:  30 Second Sit to Stand: 8 reps SLS: R - 30 sec; L - unable   GAIT: Distance walked: 51ft Assistive device utilized: None Level of assistance: Modified independence Comments: antalgic gait in CAM L LE     TREATMENT: [facility] Adult PT Treatment:                                                DATE: 01/25/2022 Therapeutic Exercise: NuStep lvl 4 LE only x 4 min while taking subjective Seated heel raises 2x15  Long sitting calf stretch 2x30" L with strap L ankle DF YTB 2x10 L ankle inv/ev 2x10 - PT manual hold of L tibia to assist Step ups 4in 2x10 L leading  Standing heel raise 2x10 Tandem stance L back 2x30" Modalities: Vasocompression  34 Low L ankle x 10 min  [facility] Adult PT Treatment:                                                DATE: 01/19/2022 Therapeutic Exercise: Long sitting calf stretch x 30" L Ankle pumps x 20  Seated L ankle  inv/ev x 5  Tandem stance L back x 30"   PATIENT EDUCATION:  Education details: eval findings, FOTO, HEP, POC Person educated: Patient Education method: Explanation, Demonstration, and Handouts Education comprehension: verbalized understanding and returned demonstration   HOME EXERCISE PROGRAM: Access Code: [email or web address] URL: [email or web address] Date: 01/19/2022 Prepared by: Edwinna Areola   Exercises - Long Sitting Calf Stretch with Strap  - 3 x daily - 7 x weekly - 2 reps - 30 sec hold - Supine Ankle Pumps  - 3 x daily - 7 x weekly - 20 reps - Ankle Inversion Eversion Towel Slide  - 3 x daily - 7 x weekly - 10 reps - Standing Tandem Balance with Counter Support  - 3 x daily - 7 x weekly - 3 sets - 2-3 reps - 30 sec hold   ASSESSMENT:   CLINICAL IMPRESSION: Pt was able to complete all prescribed exercises with no adverse effect or increase in  pain. Therapy focused on improving motor control of L ankle and L ankle ROM. Progressing well with therapy, will continue per POC.    OBJECTIVE IMPAIRMENTS: Abnormal gait, decreased activity tolerance, decreased balance, decreased endurance, decreased mobility, difficulty walking, decreased ROM, decreased strength, and pain.    ACTIVITY LIMITATIONS: carrying, lifting, standing, squatting, stairs, transfers, and locomotion level   PARTICIPATION LIMITATIONS: driving, shopping, community activity, occupation, and yard work   PERSONAL FACTORS: None     GOALS: Goals reviewed with patient? No   SHORT TERM GOALS: Target date: 02/09/2022   Pt will be compliant and knowledgeable with initial HEP for improved comfort and carryover Baseline: initial HEP given  Goal status: INITIAL   2.  Pt will self report left ankle pain no greater than 3/10 for improved comfort and functional ability Baseline: 4/10 at worst Goal status: INITIAL    LONG TERM GOALS: Target date: 03/16/2022   Pt will self report left ankle pain no greater than 1/10  for improved comfort and functional ability Baseline: 4/10 at worst Goal status: INITIAL    2.  Pt will improve FOTO function score to no less than 76% as proxy for functional improvement Baseline: 52% function Goal status: INITIAL    3.  Pt will increase 30 Second Sit to Stand rep count to no less than 13 reps for improved balance, strength, and functional mobility Baseline: 8 reps  Goal status: INITIAL    4.  Pt will improve L SLS to no less than 30 seconds for improve functional balance and mobility Baseline: unable Goal status: INITIAL   5.  Pt will be able to push/lift 100# with no increase in ankle pain for improved comfort and function with work Baseline: unable Goal status: INITIAL     PLAN:   PT FREQUENCY: 2x/week   PT DURATION: 8 weeks   PLANNED INTERVENTIONS: Therapeutic exercises, Therapeutic activity, Neuromuscular re-education, Balance training, Gait training, Patient/Family education, Self Care, Joint mobilization, Dry Needling, Electrical stimulation, Cryotherapy, Taping, Vasopneumatic device, Manual therapy, and Re-evaluation   PLAN FOR NEXT SESSION: assess HEP response, ankle strengthening and stability   Eloy End, PT 01/25/2022, 8:42 AM

## 2022-01-25 NOTE — Therapy (Signed)
OUTPATIENT PHYSICAL THERAPY TREATMENT NOTE   Patient Name: Alyssa Roberts MRN: [id number] DOB:[birth year], 31 y.o., female Today's Date: 01/26/2022  PCP: Fleet Contras, MD  REFERRING PROVIDER: Myra Rude, MD   END OF SESSION:   PT End of Session - 01/26/22 0744     Visit Number 3    Number of Visits 17    Date for PT Re-Evaluation 03/16/22    Authorization Type BCBS    PT Start Time 0745    PT Stop Time 0825    PT Time Calculation (min) 40 min    Activity Tolerance Patient tolerated treatment well    Behavior During Therapy [facility] for tasks assessed/performed              Past Medical History:  Diagnosis Date   Asthma    History reviewed. No pertinent surgical history. Patient Active Problem List   Diagnosis Date Noted   Hamstring strain, left, initial encounter 12/14/2021   Contusion of bone 12/14/2021    REFERRING DIAG: T14.8XXA (ICD-10-CM) - Contusion of bone    THERAPY DIAG:  Pain in left ankle and joints of left foot  Muscle weakness (generalized)  Unsteadiness on feet  Rationale for Evaluation and Treatment Rehabilitation  PERTINENT HISTORY: None   PRECAUTIONS: None   SUBJECTIVE:                                                                                                                                                                                      SUBJECTIVE STATEMENT:  Pt presents to PT with reports of no pain, still has stiffness in L ankle. Is ready to begin PT at this time.    PAIN:  Are you having pain?  Yes: NPRS scale: 0/10 Pain location: posterior heel, lateral L ankle Pain description: sharp, stiff Aggravating factors: walking, prolonged standing Relieving factors: elevation   OBJECTIVE: (objective measures completed at initial evaluation unless otherwise dated)  DIAGNOSTIC FINDINGS:             See imaging for recent MRI of L ankle   PATIENT SURVEYS:  FOTO: 52% function; 76% predicted    COGNITION: Overall  cognitive status: Within functional limits for tasks assessed                         SENSATION: WFL   EDEMA:  Figure 8: R: 26cm  L: 27cm   POSTURE: No Significant postural limitations   PALPATION: TTP to L lateral ankle   LOWER EXTREMITY ROM:   Active ROM Right eval Left eval  Hip flexion      Hip extension  Hip abduction      Hip adduction      Hip internal rotation      Hip external rotation      Knee flexion      Knee extension      Ankle dorsiflexion WNL -2  Ankle plantarflexion      Ankle inversion      Ankle eversion       (Blank rows = not tested)   LOWER EXTREMITY MMT:   MMT Right eval Left eval  Hip flexion      Hip extension      Hip abduction      Hip adduction      Hip internal rotation      Hip external rotation      Knee flexion      Knee extension      Ankle dorsiflexion      Ankle plantarflexion      Ankle inversion      Ankle eversion       (Blank rows = not tested)   LOWER EXTREMITY SPECIAL TESTS:  DNT   FUNCTIONAL TESTS:  30 Second Sit to Stand: 8 reps SLS: R - 30 sec; L - unable   GAIT: Distance walked: 19ft Assistive device utilized: None Level of assistance: Modified independence Comments: antalgic gait in CAM L LE     TREATMENT: [facility] Adult PT Treatment:                                                DATE: 01/26/2022 Therapeutic Exercise: NuStep lvl 5 LE only x 4 min while taking subjective Slant board 2x30"  Standing heel raises 2x15 BAPS L2 cw/ccw 2x10 each Long sitting calf stretch 60" L with strap L ankle 4-way YTB 2x10 each Tandem stance L back 2x30" Modalities: Vasocompression  34 Low L ankle x 15 min  [facility] Adult PT Treatment:                                                DATE: 01/25/2022 Therapeutic Exercise: NuStep lvl 4 LE only x 4 min while taking subjective Seated heel raises 2x15  Long sitting calf stretch 2x30" L with strap L ankle DF YTB 2x10 L ankle inv/ev 2x10 - PT manual hold of L  tibia to assist Step ups 4in 2x10 L leading  Standing heel raise 2x10 Tandem stance L back 2x30" Modalities: Vasocompression  34 Low L ankle x 10 min  [facility] Adult PT Treatment:                                                DATE: 01/19/2022 Therapeutic Exercise: Long sitting calf stretch x 30" L Ankle pumps x 20  Seated L ankle inv/ev x 5  Tandem stance L back x 30"   PATIENT EDUCATION:  Education details: eval findings, FOTO, HEP, POC Person educated: Patient Education method: Explanation, Demonstration, and Handouts Education comprehension: verbalized understanding and returned demonstration   HOME EXERCISE PROGRAM: Access Code: [email or web address] URL: [email or web address] Date: 01/26/2022 Prepared by: Edwinna Areola  Exercises - Long Sitting Calf  Stretch with Strap  - 3 x daily - 7 x weekly - 2 reps - 30 sec hold - Supine Ankle Pumps  - 3 x daily - 7 x weekly - 20 reps - Ankle Inversion Eversion Towel Slide  - 3 x daily - 7 x weekly - 10 reps - Standing Tandem Balance with Counter Support  - 3 x daily - 7 x weekly - 3 sets - 2-3 reps - 30 sec hold - Ankle Dorsiflexion with Resistance  - 1 x daily - 7 x weekly - 2 sets - 10 reps - yellow tband hold - Seated Ankle Inversion with Resistance and Legs Crossed  - 1 x daily - 7 x weekly - 2 sets - 10 reps - yellow tband hold - Long Sitting Ankle Eversion with Resistance  - 1 x daily - 7 x weekly - 2 sets - 10 reps - yellow tband hold - Ankle and Toe Plantarflexion with Resistance  - 1 x daily - 7 x weekly - 2 sets - 10 reps - yellow tband hold   ASSESSMENT:   CLINICAL IMPRESSION: Pt was able to complete all prescribed exericses with no adverse effect. HEP updated for improving distal LE strength and motor control of L ankle. Is progressing well with therapy, will continue per POC.     OBJECTIVE IMPAIRMENTS: Abnormal gait, decreased activity tolerance, decreased balance, decreased endurance, decreased mobility, difficulty  walking, decreased ROM, decreased strength, and pain.    ACTIVITY LIMITATIONS: carrying, lifting, standing, squatting, stairs, transfers, and locomotion level   PARTICIPATION LIMITATIONS: driving, shopping, community activity, occupation, and yard work   PERSONAL FACTORS: None     GOALS: Goals reviewed with patient? No   SHORT TERM GOALS: Target date: 02/09/2022   Pt will be compliant and knowledgeable with initial HEP for improved comfort and carryover Baseline: initial HEP given  Goal status: INITIAL   2.  Pt will self report left ankle pain no greater than 3/10 for improved comfort and functional ability Baseline: 4/10 at worst Goal status: INITIAL    LONG TERM GOALS: Target date: 03/16/2022   Pt will self report left ankle pain no greater than 1/10 for improved comfort and functional ability Baseline: 4/10 at worst Goal status: INITIAL    2.  Pt will improve FOTO function score to no less than 76% as proxy for functional improvement Baseline: 52% function Goal status: INITIAL    3.  Pt will increase 30 Second Sit to Stand rep count to no less than 13 reps for improved balance, strength, and functional mobility Baseline: 8 reps  Goal status: INITIAL    4.  Pt will improve L SLS to no less than 30 seconds for improve functional balance and mobility Baseline: unable Goal status: INITIAL   5.  Pt will be able to push/lift 100# with no increase in ankle pain for improved comfort and function with work Baseline: unable Goal status: INITIAL     PLAN:   PT FREQUENCY: 2x/week   PT DURATION: 8 weeks   PLANNED INTERVENTIONS: Therapeutic exercises, Therapeutic activity, Neuromuscular re-education, Balance training, Gait training, Patient/Family education, Self Care, Joint mobilization, Dry Needling, Electrical stimulation, Cryotherapy, Taping, Vasopneumatic device, Manual therapy, and Re-evaluation   PLAN FOR NEXT SESSION: assess HEP response, ankle strengthening and  stability   Eloy End, PT 01/26/2022, 8:31 AM

## 2022-01-26 ENCOUNTER — Ambulatory Visit: Payer: BC Managed Care – PPO

## 2022-01-26 DIAGNOSIS — M6281 Muscle weakness (generalized): Secondary | ICD-10-CM

## 2022-01-26 DIAGNOSIS — T148XXA Other injury of unspecified body region, initial encounter: Secondary | ICD-10-CM | POA: Diagnosis not present

## 2022-01-26 DIAGNOSIS — M25572 Pain in left ankle and joints of left foot: Secondary | ICD-10-CM

## 2022-01-26 DIAGNOSIS — R2681 Unsteadiness on feet: Secondary | ICD-10-CM | POA: Diagnosis not present

## 2022-02-02 ENCOUNTER — Ambulatory Visit: Payer: BC Managed Care – PPO | Attending: Family Medicine

## 2022-02-02 DIAGNOSIS — R2681 Unsteadiness on feet: Secondary | ICD-10-CM | POA: Diagnosis not present

## 2022-02-02 DIAGNOSIS — M25572 Pain in left ankle and joints of left foot: Secondary | ICD-10-CM | POA: Diagnosis not present

## 2022-02-02 DIAGNOSIS — M6281 Muscle weakness (generalized): Secondary | ICD-10-CM | POA: Insufficient documentation

## 2022-02-02 NOTE — Therapy (Signed)
OUTPATIENT PHYSICAL THERAPY TREATMENT NOTE   Patient Name: Alyssa Roberts MRN: [id number] DOB:01/13/1991, 31 y.o., female Today's Date: 02/02/2022  PCP: Fleet Contras, MD  REFERRING PROVIDER: Myra Rude, MD   END OF SESSION:   PT End of Session - 02/02/22 0912     Visit Number 4    Number of Visits 17    Date for PT Re-Evaluation 03/16/22    Authorization Type BCBS    PT Start Time 0915    PT Stop Time 0954    PT Time Calculation (min) 39 min    Activity Tolerance Patient tolerated treatment well    Behavior During Therapy [facility] for tasks assessed/performed               Past Medical History:  Diagnosis Date   Asthma    History reviewed. No pertinent surgical history. Patient Active Problem List   Diagnosis Date Noted   Hamstring strain, left, initial encounter 12/14/2021   Contusion of bone 12/14/2021    REFERRING DIAG: T14.8XXA (ICD-10-CM) - Contusion of bone    THERAPY DIAG:  Pain in left ankle and joints of left foot  Muscle weakness (generalized)  Unsteadiness on feet  Rationale for Evaluation and Treatment Rehabilitation  PERTINENT HISTORY: None   PRECAUTIONS: None   SUBJECTIVE:                                                                                                                                                                                      SUBJECTIVE STATEMENT:  Pt presents to PT with no current reports of pain or discomfort. Has been compliant with HEP with no adverse effect. Pt is ready to begin PT at this time.    PAIN:  Are you having pain?  Yes: NPRS scale: 0/10 Pain location: posterior heel, lateral L ankle Pain description: sharp, stiff Aggravating factors: walking, prolonged standing Relieving factors: elevation   OBJECTIVE: (objective measures completed at initial evaluation unless otherwise dated)  DIAGNOSTIC FINDINGS:             See imaging for recent MRI of L ankle   PATIENT SURVEYS:  FOTO: 52%  function; 76% predicted    COGNITION: Overall cognitive status: Within functional limits for tasks assessed                         SENSATION: WFL   EDEMA:  Figure 8: R: 26cm  L: 27cm   POSTURE: No Significant postural limitations   PALPATION: TTP to L lateral ankle   LOWER EXTREMITY ROM:   Active ROM Right eval Left eval  Hip flexion  Hip extension      Hip abduction      Hip adduction      Hip internal rotation      Hip external rotation      Knee flexion      Knee extension      Ankle dorsiflexion WNL -2  Ankle plantarflexion      Ankle inversion      Ankle eversion       (Blank rows = not tested)   LOWER EXTREMITY MMT:   MMT Right eval Left eval  Hip flexion      Hip extension      Hip abduction      Hip adduction      Hip internal rotation      Hip external rotation      Knee flexion      Knee extension      Ankle dorsiflexion      Ankle plantarflexion      Ankle inversion      Ankle eversion       (Blank rows = not tested)   LOWER EXTREMITY SPECIAL TESTS:  DNT   FUNCTIONAL TESTS:  30 Second Sit to Stand: 8 reps SLS: R - 30 sec; L - unable   GAIT: Distance walked: 8325ft Assistive device utilized: None Level of assistance: Modified independence Comments: antalgic gait in CAM L LE     TREATMENT: [facility] Adult PT Treatment:                                                DATE: 02/02/2022 Therapeutic Exercise: NuStep lvl 5 LE only x 4 min while taking subjective Slant board 2x30"  Standing heel raises 2x15 Step ups 6in 2x10 L leading L ankle PF 2x15 black TB L ankle DF/inv/ev 2x10 YTB BAPS L2 cw/ccw 2x10 each STS 2x10 - low table no UE Tandem stance L back 2x30" Modalities: Vasocompression  34 Low L ankle x 15 min  [facility] Adult PT Treatment:                                                DATE: 01/26/2022 Therapeutic Exercise: NuStep lvl 5 LE only x 4 min while taking subjective Slant board 2x30"  Standing heel raises 2x15 BAPS  L2 cw/ccw 2x10 each Long sitting calf stretch 60" L with strap L ankle 4-way YTB 2x10 each Tandem stance L back 2x30" Modalities: Vasocompression  34 Low L ankle x 15 min  [facility] Adult PT Treatment:                                                DATE: 01/25/2022 Therapeutic Exercise: NuStep lvl 4 LE only x 4 min while taking subjective Seated heel raises 2x15  Long sitting calf stretch 2x30" L with strap L ankle DF YTB 2x10 L ankle inv/ev 2x10 - PT manual hold of L tibia to assist Step ups 4in 2x10 L leading  Standing heel raise 2x10 Tandem stance L back 2x30" Modalities: Vasocompression  34 Low L ankle x 10 min  PATIENT EDUCATION:  Education  details: continue HEP Person educated: Patient Education method: Explanation, Demonstration, and Handouts Education comprehension: verbalized understanding and returned demonstration   HOME EXERCISE PROGRAM: Access Code: [email or web address] URL: [email or web address] Date: 01/26/2022 Prepared by: Edwinna Areola  Exercises - Long Sitting Calf Stretch with Strap  - 3 x daily - 7 x weekly - 2 reps - 30 sec hold - Supine Ankle Pumps  - 3 x daily - 7 x weekly - 20 reps - Ankle Inversion Eversion Towel Slide  - 3 x daily - 7 x weekly - 10 reps - Standing Tandem Balance with Counter Support  - 3 x daily - 7 x weekly - 3 sets - 2-3 reps - 30 sec hold - Ankle Dorsiflexion with Resistance  - 1 x daily - 7 x weekly - 2 sets - 10 reps - yellow tband hold - Seated Ankle Inversion with Resistance and Legs Crossed  - 1 x daily - 7 x weekly - 2 sets - 10 reps - yellow tband hold - Long Sitting Ankle Eversion with Resistance  - 1 x daily - 7 x weekly - 2 sets - 10 reps - yellow tband hold - Ankle and Toe Plantarflexion with Resistance  - 1 x daily - 7 x weekly - 2 sets - 10 reps - yellow tband hold   ASSESSMENT:   CLINICAL IMPRESSION: Pt was able to complete all prescribed exericses with no adverse effect. HEP updated for improving distal LE  strength and motor control of L ankle. Is progressing well with therapy, will continue per POC.      OBJECTIVE IMPAIRMENTS: Abnormal gait, decreased activity tolerance, decreased balance, decreased endurance, decreased mobility, difficulty walking, decreased ROM, decreased strength, and pain.    ACTIVITY LIMITATIONS: carrying, lifting, standing, squatting, stairs, transfers, and locomotion level   PARTICIPATION LIMITATIONS: driving, shopping, community activity, occupation, and yard work   PERSONAL FACTORS: None     GOALS: Goals reviewed with patient? No   SHORT TERM GOALS: Target date: 02/09/2022   Pt will be compliant and knowledgeable with initial HEP for improved comfort and carryover Baseline: initial HEP given  Goal status: INITIAL   2.  Pt will self report left ankle pain no greater than 3/10 for improved comfort and functional ability Baseline: 4/10 at worst Goal status: INITIAL    LONG TERM GOALS: Target date: 03/16/2022   Pt will self report left ankle pain no greater than 1/10 for improved comfort and functional ability Baseline: 4/10 at worst Goal status: INITIAL    2.  Pt will improve FOTO function score to no less than 76% as proxy for functional improvement Baseline: 52% function Goal status: INITIAL    3.  Pt will increase 30 Second Sit to Stand rep count to no less than 13 reps for improved balance, strength, and functional mobility Baseline: 8 reps  Goal status: INITIAL    4.  Pt will improve L SLS to no less than 30 seconds for improve functional balance and mobility Baseline: unable Goal status: INITIAL   5.  Pt will be able to push/lift 100# with no increase in ankle pain for improved comfort and function with work Baseline: unable Goal status: INITIAL     PLAN:   PT FREQUENCY: 2x/week   PT DURATION: 8 weeks   PLANNED INTERVENTIONS: Therapeutic exercises, Therapeutic activity, Neuromuscular re-education, Balance training, Gait training,  Patient/Family education, Self Care, Joint mobilization, Dry Needling, Electrical stimulation, Cryotherapy, Taping, Vasopneumatic device, Manual therapy, and Re-evaluation   PLAN FOR NEXT  SESSION: assess HEP response, ankle strengthening and stability   Eloy End, PT 02/02/2022, 9:54 AM

## 2022-02-03 NOTE — Therapy (Signed)
OUTPATIENT PHYSICAL THERAPY TREATMENT NOTE   Patient Name: Alyssa Roberts MRN: [id number] DOB:16-Mar-1990, 31 y.o., female Today's Date: 02/04/2022  PCP: Fleet Contras, MD  REFERRING PROVIDER: Myra Rude, MD   END OF SESSION:   PT End of Session - 02/04/22 0913     Visit Number 5    Number of Visits 17    Date for PT Re-Evaluation 03/16/22    Authorization Type [employer]    PT Start Time 0915    PT Stop Time 0954    PT Time Calculation (min) 39 min    Activity Tolerance Patient tolerated treatment well    Behavior During Therapy [facility] for tasks assessed/performed                Past Medical History:  Diagnosis Date   Asthma    History reviewed. No pertinent surgical history. Patient Active Problem List   Diagnosis Date Noted   Hamstring strain, left, initial encounter 12/14/2021   Contusion of bone 12/14/2021    REFERRING DIAG: T14.8XXA (ICD-10-CM) - Contusion of bone    THERAPY DIAG:  Pain in left ankle and joints of left foot  Muscle weakness (generalized)  Unsteadiness on feet  Rationale for Evaluation and Treatment Rehabilitation  PERTINENT HISTORY: None   PRECAUTIONS: None   SUBJECTIVE:                                                                                                                                                                                      SUBJECTIVE STATEMENT:  Pt presents to PT with no current reports of pain. Notes that her pain is coming down and HEP is going well. Pt is ready to begin PT at this time.    PAIN:  Are you having pain?  Yes: NPRS scale: 0/10 Pain location: posterior heel, lateral L ankle Pain description: sharp, stiff Aggravating factors: walking, prolonged standing Relieving factors: elevation   OBJECTIVE: (objective measures completed at initial evaluation unless otherwise dated)  DIAGNOSTIC FINDINGS:             See imaging for recent MRI of L ankle   PATIENT SURVEYS:  FOTO: 52% function;  76% predicted    COGNITION: Overall cognitive status: Within functional limits for tasks assessed                         SENSATION: WFL   EDEMA:  Figure 8: R: 26cm  L: 27cm   POSTURE: No Significant postural limitations   PALPATION: TTP to L lateral ankle   LOWER EXTREMITY ROM:   Active ROM Right eval Left eval  Hip flexion  Hip extension      Hip abduction      Hip adduction      Hip internal rotation      Hip external rotation      Knee flexion      Knee extension      Ankle dorsiflexion WNL -2  Ankle plantarflexion      Ankle inversion      Ankle eversion       (Blank rows = not tested)   LOWER EXTREMITY MMT:   MMT Right eval Left eval  Hip flexion      Hip extension      Hip abduction      Hip adduction      Hip internal rotation      Hip external rotation      Knee flexion      Knee extension      Ankle dorsiflexion      Ankle plantarflexion      Ankle inversion      Ankle eversion       (Blank rows = not tested)   LOWER EXTREMITY SPECIAL TESTS:  DNT   FUNCTIONAL TESTS:  30 Second Sit to Stand: 8 reps SLS: R - 30 sec; L - unable   GAIT: Distance walked: 5325ft Assistive device utilized: None Level of assistance: Modified independence Comments: antalgic gait in CAM L LE     TREATMENT: [facility] Adult PT Treatment:                                                DATE: 02/04/2022 Therapeutic Exercise: Rec bike lvl 2 x 3 min while taking subjective Slant board stretch 2x45"  Standing heel raises x 20 Step ups with march 8in 2x10 L stance  L knee flex/ankle DF stretch on 8in step 2x30" L ankle PF 2x15 black TB L ankle DF/inv/ev 2x10 RTB BAPS L3 cw/ccw 2x10 each STS 2x10 - low table no UE Tandem stance L back on foam 2x30" Modalities: Vasocompression  34 Low L ankle x 15 min  [facility] Adult PT Treatment:                                                DATE: 02/02/2022 Therapeutic Exercise: NuStep lvl 5 LE only x 4 min while taking  subjective Slant board 2x30"  Standing heel raises 2x15 Step ups 6in 2x10 L leading L ankle PF 2x15 black TB L ankle DF/inv/ev 2x10 YTB BAPS L2 cw/ccw 2x10 each STS 2x10 - low table no UE Tandem stance L back 2x30" Modalities: Vasocompression  34 Low L ankle x 15 min  [facility] Adult PT Treatment:                                                DATE: 01/26/2022 Therapeutic Exercise: NuStep lvl 5 LE only x 4 min while taking subjective Slant board 2x30"  Standing heel raises 2x15 BAPS L2 cw/ccw 2x10 each Long sitting calf stretch 60" L with strap L ankle 4-way YTB 2x10 each Tandem stance L back 2x30" Modalities: Vasocompression  34 Low  L ankle x 15 min  [facility] Adult PT Treatment:                                                DATE: 01/25/2022 Therapeutic Exercise: NuStep lvl 4 LE only x 4 min while taking subjective Seated heel raises 2x15  Long sitting calf stretch 2x30" L with strap L ankle DF YTB 2x10 L ankle inv/ev 2x10 - PT manual hold of L tibia to assist Step ups 4in 2x10 L leading  Standing heel raise 2x10 Tandem stance L back 2x30" Modalities: Vasocompression  34 Low L ankle x 10 min  PATIENT EDUCATION:  Education details: continue HEP Person educated: Patient Education method: Explanation, Demonstration, and Handouts Education comprehension: verbalized understanding and returned demonstration   HOME EXERCISE PROGRAM: Access Code: [email or web address] URL: [email or web address] Date: 01/26/2022 Prepared by: Edwinna Areola  Exercises - Long Sitting Calf Stretch with Strap  - 3 x daily - 7 x weekly - 2 reps - 30 sec hold - Supine Ankle Pumps  - 3 x daily - 7 x weekly - 20 reps - Ankle Inversion Eversion Towel Slide  - 3 x daily - 7 x weekly - 10 reps - Standing Tandem Balance with Counter Support  - 3 x daily - 7 x weekly - 3 sets - 2-3 reps - 30 sec hold - Ankle Dorsiflexion with Resistance  - 1 x daily - 7 x weekly - 2 sets - 10 reps - yellow tband  hold - Seated Ankle Inversion with Resistance and Legs Crossed  - 1 x daily - 7 x weekly - 2 sets - 10 reps - yellow tband hold - Long Sitting Ankle Eversion with Resistance  - 1 x daily - 7 x weekly - 2 sets - 10 reps - yellow tband hold - Ankle and Toe Plantarflexion with Resistance  - 1 x daily - 7 x weekly - 2 sets - 10 reps - yellow tband hold   ASSESSMENT:   CLINICAL IMPRESSION: Pt was able to complete all prescribed exericses with no adverse effect. HEP updated for improving distal LE strength and motor control of L ankle. Pt demonstrated improved strength, ROM, and mobility for L ankle today. She continues to progress well with therapy, will continue per POC.      OBJECTIVE IMPAIRMENTS: Abnormal gait, decreased activity tolerance, decreased balance, decreased endurance, decreased mobility, difficulty walking, decreased ROM, decreased strength, and pain.    ACTIVITY LIMITATIONS: carrying, lifting, standing, squatting, stairs, transfers, and locomotion level   PARTICIPATION LIMITATIONS: driving, shopping, community activity, occupation, and yard work   PERSONAL FACTORS: None     GOALS: Goals reviewed with patient? No   SHORT TERM GOALS: Target date: 02/09/2022   Pt will be compliant and knowledgeable with initial HEP for improved comfort and carryover Baseline: initial HEP given  Goal status: INITIAL   2.  Pt will self report left ankle pain no greater than 3/10 for improved comfort and functional ability Baseline: 4/10 at worst Goal status: INITIAL    LONG TERM GOALS: Target date: 03/16/2022   Pt will self report left ankle pain no greater than 1/10 for improved comfort and functional ability Baseline: 4/10 at worst Goal status: INITIAL    2.  Pt will improve FOTO function score to no less than 76% as proxy for functional improvement Baseline: 52% function Goal  status: INITIAL    3.  Pt will increase 30 Second Sit to Stand rep count to no less than 13 reps for improved  balance, strength, and functional mobility Baseline: 8 reps  Goal status: INITIAL    4.  Pt will improve L SLS to no less than 30 seconds for improve functional balance and mobility Baseline: unable Goal status: INITIAL   5.  Pt will be able to push/lift 100# with no increase in ankle pain for improved comfort and function with work Baseline: unable Goal status: INITIAL     PLAN:   PT FREQUENCY: 2x/week   PT DURATION: 8 weeks   PLANNED INTERVENTIONS: Therapeutic exercises, Therapeutic activity, Neuromuscular re-education, Balance training, Gait training, Patient/Family education, Self Care, Joint mobilization, Dry Needling, Electrical stimulation, Cryotherapy, Taping, Vasopneumatic device, Manual therapy, and Re-evaluation   PLAN FOR NEXT SESSION: assess HEP response, ankle strengthening and stability   Eloy End, PT 02/04/2022, 9:57 AM

## 2022-02-04 ENCOUNTER — Ambulatory Visit: Payer: BC Managed Care – PPO

## 2022-02-04 DIAGNOSIS — M25572 Pain in left ankle and joints of left foot: Secondary | ICD-10-CM | POA: Diagnosis not present

## 2022-02-04 DIAGNOSIS — R2681 Unsteadiness on feet: Secondary | ICD-10-CM | POA: Diagnosis not present

## 2022-02-04 DIAGNOSIS — M6281 Muscle weakness (generalized): Secondary | ICD-10-CM | POA: Diagnosis not present

## 2022-02-08 NOTE — Therapy (Incomplete)
OUTPATIENT PHYSICAL THERAPY TREATMENT NOTE   Patient Name: Alyssa Roberts MRN: [id number] DOB:05-26-1990, 31 y.o., female Today's Date: 02/08/2022  PCP: Fleet Contras, MD  REFERRING PROVIDER: Myra Rude, MD   END OF SESSION:        Past Medical History:  Diagnosis Date   Asthma    No past surgical history on file. Patient Active Problem List   Diagnosis Date Noted   Hamstring strain, left, initial encounter 12/14/2021   Contusion of bone 12/14/2021    REFERRING DIAG: T14.8XXA (ICD-10-CM) - Contusion of bone    THERAPY DIAG:  No diagnosis found.  Rationale for Evaluation and Treatment Rehabilitation  PERTINENT HISTORY: None   PRECAUTIONS: None   SUBJECTIVE:                                                                                                                                                                                      SUBJECTIVE STATEMENT:  ***   PAIN:  Are you having pain?  Yes: NPRS scale: 0/10 Pain location: posterior heel, lateral L ankle Pain description: sharp, stiff Aggravating factors: walking, prolonged standing Relieving factors: elevation   OBJECTIVE: (objective measures completed at initial evaluation unless otherwise dated)  DIAGNOSTIC FINDINGS:             See imaging for recent MRI of L ankle   PATIENT SURVEYS:  FOTO: 52% function; 76% predicted    COGNITION: Overall cognitive status: Within functional limits for tasks assessed                         SENSATION: WFL   EDEMA:  Figure 8: R: 26cm  L: 27cm   POSTURE: No Significant postural limitations   PALPATION: TTP to L lateral ankle   LOWER EXTREMITY ROM:   Active ROM Right eval Left eval  Hip flexion      Hip extension      Hip abduction      Hip adduction      Hip internal rotation      Hip external rotation      Knee flexion      Knee extension      Ankle dorsiflexion WNL -2  Ankle plantarflexion      Ankle inversion      Ankle  eversion       (Blank rows = not tested)   LOWER EXTREMITY MMT:   MMT Right eval Left eval  Hip flexion      Hip extension      Hip abduction      Hip adduction      Hip internal rotation  Hip external rotation      Knee flexion      Knee extension      Ankle dorsiflexion      Ankle plantarflexion      Ankle inversion      Ankle eversion       (Blank rows = not tested)   LOWER EXTREMITY SPECIAL TESTS:  DNT   FUNCTIONAL TESTS:  30 Second Sit to Stand: 8 reps SLS: R - 30 sec; L - unable   GAIT: Distance walked: 62ft Assistive device utilized: None Level of assistance: Modified independence Comments: antalgic gait in CAM L LE     TREATMENT: [facility] Adult PT Treatment:                                                DATE: 02/09/2022 Therapeutic Exercise: Rec bike lvl 2 x 3 min while taking subjective Slant board stretch 2x45"  Standing heel raises x 20 Step ups with march 8in 2x10 L stance  L knee flex/ankle DF stretch on 8in step 2x30" L ankle PF 2x15 black TB L ankle DF/inv/ev 2x10 RTB BAPS L3 cw/ccw 2x10 each STS 2x10 - low table no UE Tandem stance L back on foam 2x30" Modalities: Vasocompression  34 Low L ankle x 15 min  [facility] Adult PT Treatment:                                                DATE: 02/04/2022 Therapeutic Exercise: Rec bike lvl 2 x 3 min while taking subjective Slant board stretch 2x45"  Standing heel raises x 20 Step ups with march 8in 2x10 L stance  L knee flex/ankle DF stretch on 8in step 2x30" L ankle PF 2x15 black TB L ankle DF/inv/ev 2x10 RTB BAPS L3 cw/ccw 2x10 each STS 2x10 - low table no UE Tandem stance L back on foam 2x30" Modalities: Vasocompression  34 Low L ankle x 15 min  [facility] Adult PT Treatment:                                                DATE: 02/02/2022 Therapeutic Exercise: NuStep lvl 5 LE only x 4 min while taking subjective Slant board 2x30"  Standing heel raises 2x15 Step ups 6in 2x10 L leading L  ankle PF 2x15 black TB L ankle DF/inv/ev 2x10 YTB BAPS L2 cw/ccw 2x10 each STS 2x10 - low table no UE Tandem stance L back 2x30" Modalities: Vasocompression  34 Low L ankle x 15 min  [facility] Adult PT Treatment:                                                DATE: 01/26/2022 Therapeutic Exercise: NuStep lvl 5 LE only x 4 min while taking subjective Slant board 2x30"  Standing heel raises 2x15 BAPS L2 cw/ccw 2x10 each Long sitting calf stretch 60" L with strap L ankle 4-way YTB 2x10 each Tandem stance L back 2x30" Modalities: Vasocompression  34 Low L ankle x 15 min  [facility] Adult PT Treatment:                                                DATE: 01/25/2022 Therapeutic Exercise: NuStep lvl 4 LE only x 4 min while taking subjective Seated heel raises 2x15  Long sitting calf stretch 2x30" L with strap L ankle DF YTB 2x10 L ankle inv/ev 2x10 - PT manual hold of L tibia to assist Step ups 4in 2x10 L leading  Standing heel raise 2x10 Tandem stance L back 2x30" Modalities: Vasocompression  34 Low L ankle x 10 min  PATIENT EDUCATION:  Education details: continue HEP Person educated: Patient Education method: Explanation, Demonstration, and Handouts Education comprehension: verbalized understanding and returned demonstration   HOME EXERCISE PROGRAM: Access Code: [email or web address] URL: [email or web address] Date: 01/26/2022 Prepared by: Edwinna Areolaavid Liani Caris  Exercises - Long Sitting Calf Stretch with Strap  - 3 x daily - 7 x weekly - 2 reps - 30 sec hold - Supine Ankle Pumps  - 3 x daily - 7 x weekly - 20 reps - Ankle Inversion Eversion Towel Slide  - 3 x daily - 7 x weekly - 10 reps - Standing Tandem Balance with Counter Support  - 3 x daily - 7 x weekly - 3 sets - 2-3 reps - 30 sec hold - Ankle Dorsiflexion with Resistance  - 1 x daily - 7 x weekly - 2 sets - 10 reps - yellow tband hold - Seated Ankle Inversion with Resistance and Legs Crossed  - 1 x daily - 7 x weekly -  2 sets - 10 reps - yellow tband hold - Long Sitting Ankle Eversion with Resistance  - 1 x daily - 7 x weekly - 2 sets - 10 reps - yellow tband hold - Ankle and Toe Plantarflexion with Resistance  - 1 x daily - 7 x weekly - 2 sets - 10 reps - yellow tband hold   ASSESSMENT:   CLINICAL IMPRESSION: ***     OBJECTIVE IMPAIRMENTS: Abnormal gait, decreased activity tolerance, decreased balance, decreased endurance, decreased mobility, difficulty walking, decreased ROM, decreased strength, and pain.    ACTIVITY LIMITATIONS: carrying, lifting, standing, squatting, stairs, transfers, and locomotion level   PARTICIPATION LIMITATIONS: driving, shopping, community activity, occupation, and yard work   PERSONAL FACTORS: None     GOALS: Goals reviewed with patient? No   SHORT TERM GOALS: Target date: 02/09/2022   Pt will be compliant and knowledgeable with initial HEP for improved comfort and carryover Baseline: initial HEP given  Goal status: INITIAL   2.  Pt will self report left ankle pain no greater than 3/10 for improved comfort and functional ability Baseline: 4/10 at worst Goal status: INITIAL    LONG TERM GOALS: Target date: 03/16/2022   Pt will self report left ankle pain no greater than 1/10 for improved comfort and functional ability Baseline: 4/10 at worst Goal status: INITIAL    2.  Pt will improve FOTO function score to no less than 76% as proxy for functional improvement Baseline: 52% function Goal status: INITIAL    3.  Pt will increase 30 Second Sit to Stand rep count to no less than 13 reps for improved balance, strength, and functional mobility Baseline: 8 reps  Goal status: INITIAL    4.  Pt will improve  L SLS to no less than 30 seconds for improve functional balance and mobility Baseline: unable Goal status: INITIAL   5.  Pt will be able to push/lift 100# with no increase in ankle pain for improved comfort and function with work Baseline: unable Goal status:  INITIAL     PLAN:   PT FREQUENCY: 2x/week   PT DURATION: 8 weeks   PLANNED INTERVENTIONS: Therapeutic exercises, Therapeutic activity, Neuromuscular re-education, Balance training, Gait training, Patient/Family education, Self Care, Joint mobilization, Dry Needling, Electrical stimulation, Cryotherapy, Taping, Vasopneumatic device, Manual therapy, and Re-evaluation   PLAN FOR NEXT SESSION: assess HEP response, ankle strengthening and stability   Eloy End, PT 02/08/2022, 11:12 AM

## 2022-02-09 ENCOUNTER — Telehealth: Payer: Self-pay

## 2022-02-09 ENCOUNTER — Ambulatory Visit: Payer: BC Managed Care – PPO

## 2022-02-09 NOTE — Telephone Encounter (Signed)
PT called and spoke with patient regarding missed appointment. She stated she slept through her alarm.  Confirmed next appointment.  Eloy End   02/09/22 9:51 AM

## 2022-02-10 NOTE — Therapy (Signed)
OUTPATIENT PHYSICAL THERAPY TREATMENT NOTE   Patient Name: Alyssa Roberts MRN: [id number] DOB:Mar 06, 1990, 31 y.o., female Today's Date: 02/11/2022  PCP: Fleet Contras, MD  REFERRING PROVIDER: Myra Rude, MD   END OF SESSION:   PT End of Session - 02/11/22 0910     Visit Number 6    Number of Visits 17    Date for PT Re-Evaluation 03/16/22    Authorization Type BCBS    PT Start Time 0912    PT Stop Time 0953    PT Time Calculation (min) 41 min    Activity Tolerance Patient tolerated treatment well    Behavior During Therapy [facility] for tasks assessed/performed                 Past Medical History:  Diagnosis Date   Asthma    History reviewed. No pertinent surgical history. Patient Active Problem List   Diagnosis Date Noted   Hamstring strain, left, initial encounter 12/14/2021   Contusion of bone 12/14/2021    REFERRING DIAG: T14.8XXA (ICD-10-CM) - Contusion of bone    THERAPY DIAG:  Pain in left ankle and joints of left foot  Muscle weakness (generalized)  Unsteadiness on feet  Rationale for Evaluation and Treatment Rehabilitation  PERTINENT HISTORY: None   PRECAUTIONS: None   SUBJECTIVE:                                                                                                                                                                                      SUBJECTIVE STATEMENT:  Pt presents to PT with decreased pain reports in L ankle. Has been compliant with HEP with no adverse effect. Pt is ready to begin PT at this time.    PAIN:  Are you having pain?  Yes: NPRS scale: 0/10 Pain location: posterior heel, lateral L ankle Pain description: sharp, stiff Aggravating factors: walking, prolonged standing Relieving factors: elevation   OBJECTIVE: (objective measures completed at initial evaluation unless otherwise dated)  DIAGNOSTIC FINDINGS:             See imaging for recent MRI of L ankle   PATIENT SURVEYS:  FOTO: 52%  function; 76% predicted    COGNITION: Overall cognitive status: Within functional limits for tasks assessed                         SENSATION: WFL   EDEMA:  Figure 8: R: 26cm  L: 27cm   POSTURE: No Significant postural limitations   PALPATION: TTP to L lateral ankle   LOWER EXTREMITY ROM:   Active ROM Right eval Left eval  Hip flexion  Hip extension      Hip abduction      Hip adduction      Hip internal rotation      Hip external rotation      Knee flexion      Knee extension      Ankle dorsiflexion WNL -2  Ankle plantarflexion      Ankle inversion      Ankle eversion       (Blank rows = not tested)   LOWER EXTREMITY MMT:   MMT Right eval Left eval  Hip flexion      Hip extension      Hip abduction      Hip adduction      Hip internal rotation      Hip external rotation      Knee flexion      Knee extension      Ankle dorsiflexion      Ankle plantarflexion      Ankle inversion      Ankle eversion       (Blank rows = not tested)   LOWER EXTREMITY SPECIAL TESTS:  DNT   FUNCTIONAL TESTS:  30 Second Sit to Stand: 8 reps SLS: R - 30 sec; L - unable   GAIT: Distance walked: 3525ft Assistive device utilized: None Level of assistance: Modified independence Comments: antalgic gait in CAM L LE     TREATMENT: [facility] Adult PT Treatment:                                                DATE: 02/11/2022 Therapeutic Exercise: Rec bike lvl 3 x 4 min while taking subjective Slant board stretch 2x45"  Standing heel raises x 25 Step ups with march 8in x 10 each Step ups lat x 10 8in L knee flex/ankle DF stretch on 8in step 2x30" L ankle PF 2x15 black TB L ankle DF/inv/ev x 15 RTB BAPS L3 cw/ccw 2x10 each STS 2x10 - low table no UE Tandem stance L back on foam 2x30" L kickstand 2x10 KB 10# Tandme walk 2x6315ft Modalities: Vasocompression  34 Low L ankle x 15 min  [facility] Adult PT Treatment:                                                DATE:  02/04/2022 Therapeutic Exercise: Rec bike lvl 2 x 3 min while taking subjective Slant board stretch 2x45"  Standing heel raises x 20 Step ups with march 8in 2x10 L stance  L knee flex/ankle DF stretch on 8in step 2x30" L ankle PF 2x15 black TB L ankle DF/inv/ev 2x10 RTB BAPS L3 cw/ccw 2x10 each STS 2x10 - low table no UE Tandem stance L back on foam 2x30" Modalities: Vasocompression  34 Low L ankle x 15 min  [facility] Adult PT Treatment:                                                DATE: 02/02/2022 Therapeutic Exercise: NuStep lvl 5 LE only x 4 min while taking subjective Slant board 2x30"  Standing heel raises 2x15  Step ups 6in 2x10 L leading L ankle PF 2x15 black TB L ankle DF/inv/ev 2x10 YTB BAPS L2 cw/ccw 2x10 each STS 2x10 - low table no UE Tandem stance L back 2x30" Modalities: Vasocompression  34 Low L ankle x 15 min  [facility] Adult PT Treatment:                                                DATE: 01/26/2022 Therapeutic Exercise: NuStep lvl 5 LE only x 4 min while taking subjective Slant board 2x30"  Standing heel raises 2x15 BAPS L2 cw/ccw 2x10 each Long sitting calf stretch 60" L with strap L ankle 4-way YTB 2x10 each Tandem stance L back 2x30" Modalities: Vasocompression  34 Low L ankle x 15 min  [facility] Adult PT Treatment:                                                DATE: 01/25/2022 Therapeutic Exercise: NuStep lvl 4 LE only x 4 min while taking subjective Seated heel raises 2x15  Long sitting calf stretch 2x30" L with strap L ankle DF YTB 2x10 L ankle inv/ev 2x10 - PT manual hold of L tibia to assist Step ups 4in 2x10 L leading  Standing heel raise 2x10 Tandem stance L back 2x30" Modalities: Vasocompression  34 Low L ankle x 10 min  PATIENT EDUCATION:  Education details: continue HEP Person educated: Patient Education method: Explanation, Demonstration, and Handouts Education comprehension: verbalized understanding and returned  demonstration   HOME EXERCISE PROGRAM: Access Code: [email or web address] URL: [email or web address] Date: 01/26/2022 Prepared by: Edwinna Areola  Exercises - Long Sitting Calf Stretch with Strap  - 3 x daily - 7 x weekly - 2 reps - 30 sec hold - Supine Ankle Pumps  - 3 x daily - 7 x weekly - 20 reps - Ankle Inversion Eversion Towel Slide  - 3 x daily - 7 x weekly - 10 reps - Standing Tandem Balance with Counter Support  - 3 x daily - 7 x weekly - 3 sets - 2-3 reps - 30 sec hold - Ankle Dorsiflexion with Resistance  - 1 x daily - 7 x weekly - 2 sets - 10 reps - yellow tband hold - Seated Ankle Inversion with Resistance and Legs Crossed  - 1 x daily - 7 x weekly - 2 sets - 10 reps - yellow tband hold - Long Sitting Ankle Eversion with Resistance  - 1 x daily - 7 x weekly - 2 sets - 10 reps - yellow tband hold - Ankle and Toe Plantarflexion with Resistance  - 1 x daily - 7 x weekly - 2 sets - 10 reps - yellow tband hold   ASSESSMENT:   CLINICAL IMPRESSION: Pt was able to complete all prescribed exericses with no adverse effect. HEP updated for improving distal LE strength and motor control of L ankle. Pt demonstrated improved strength, ROM, and mobility for L ankle today. She continues to progress well with therapy, will continue per POC.         OBJECTIVE IMPAIRMENTS: Abnormal gait, decreased activity tolerance, decreased balance, decreased endurance, decreased mobility, difficulty walking, decreased ROM, decreased strength, and pain.    ACTIVITY LIMITATIONS: carrying, lifting, standing, squatting, stairs,  transfers, and locomotion level   PARTICIPATION LIMITATIONS: driving, shopping, community activity, occupation, and yard work   PERSONAL FACTORS: None     GOALS: Goals reviewed with patient? No   SHORT TERM GOALS: Target date: 02/09/2022   Pt will be compliant and knowledgeable with initial HEP for improved comfort and carryover Baseline: initial HEP given  Goal status:  INITIAL   2.  Pt will self report left ankle pain no greater than 3/10 for improved comfort and functional ability Baseline: 4/10 at worst Goal status: INITIAL    LONG TERM GOALS: Target date: 03/16/2022   Pt will self report left ankle pain no greater than 1/10 for improved comfort and functional ability Baseline: 4/10 at worst Goal status: INITIAL    2.  Pt will improve FOTO function score to no less than 76% as proxy for functional improvement Baseline: 52% function Goal status: INITIAL    3.  Pt will increase 30 Second Sit to Stand rep count to no less than 13 reps for improved balance, strength, and functional mobility Baseline: 8 reps  Goal status: INITIAL    4.  Pt will improve L SLS to no less than 30 seconds for improve functional balance and mobility Baseline: unable Goal status: INITIAL   5.  Pt will be able to push/lift 100# with no increase in ankle pain for improved comfort and function with work Baseline: unable Goal status: INITIAL     PLAN:   PT FREQUENCY: 2x/week   PT DURATION: 8 weeks   PLANNED INTERVENTIONS: Therapeutic exercises, Therapeutic activity, Neuromuscular re-education, Balance training, Gait training, Patient/Family education, Self Care, Joint mobilization, Dry Needling, Electrical stimulation, Cryotherapy, Taping, Vasopneumatic device, Manual therapy, and Re-evaluation   PLAN FOR NEXT SESSION: assess HEP response, ankle strengthening and stability   Eloy End, PT 02/11/2022, 9:53 AM

## 2022-02-11 ENCOUNTER — Ambulatory Visit: Payer: BC Managed Care – PPO

## 2022-02-11 DIAGNOSIS — R2681 Unsteadiness on feet: Secondary | ICD-10-CM | POA: Diagnosis not present

## 2022-02-11 DIAGNOSIS — M25572 Pain in left ankle and joints of left foot: Secondary | ICD-10-CM

## 2022-02-11 DIAGNOSIS — M6281 Muscle weakness (generalized): Secondary | ICD-10-CM

## 2022-02-15 NOTE — Therapy (Signed)
OUTPATIENT PHYSICAL THERAPY TREATMENT NOTE   Patient Name: Alyssa Roberts MRN: [id number] DOB:22-Mar-1990, 31 y.o., female Today's Date: 02/16/2022  PCP: Fleet Contras, MD  REFERRING PROVIDER: Myra Rude, MD   END OF SESSION:   PT End of Session - 02/16/22 0909     Visit Number 7    Number of Visits 17    Date for PT Re-Evaluation 03/16/22    Authorization Type BCBS    PT Start Time 0915    PT Stop Time 0958    PT Time Calculation (min) 43 min    Activity Tolerance Patient tolerated treatment well    Behavior During Therapy [facility] for tasks assessed/performed                  Past Medical History:  Diagnosis Date   Asthma    History reviewed. No pertinent surgical history. Patient Active Problem List   Diagnosis Date Noted   Hamstring strain, left, initial encounter 12/14/2021   Contusion of bone 12/14/2021    REFERRING DIAG: T14.8XXA (ICD-10-CM) - Contusion of bone    THERAPY DIAG:  Pain in left ankle and joints of left foot  Muscle weakness (generalized)  Unsteadiness on feet  Rationale for Evaluation and Treatment Rehabilitation  PERTINENT HISTORY: None   PRECAUTIONS: None   SUBJECTIVE:                                                                                                                                                                                      SUBJECTIVE STATEMENT:  Pt presents to PT with no current ankle pain, notes some general muscle soreness after starting back at work yesterday. Pt has been compliant with HEP with no adverse effect. Pt is ready to begin PT at this time.    PAIN:  Are you having pain?  Yes: NPRS scale: 0/10 Pain location: posterior heel, lateral L ankle Pain description: sharp, stiff Aggravating factors: walking, prolonged standing Relieving factors: elevation   OBJECTIVE: (objective measures completed at initial evaluation unless otherwise dated)  DIAGNOSTIC FINDINGS:             See imaging  for recent MRI of L ankle   PATIENT SURVEYS:  FOTO: 52% function; 76% predicted    COGNITION: Overall cognitive status: Within functional limits for tasks assessed                         SENSATION: WFL   EDEMA:  Figure 8: R: 26cm  L: 27cm   POSTURE: No Significant postural limitations   PALPATION: TTP to L lateral ankle   LOWER EXTREMITY ROM:  Active ROM Left eval Left 02/16/22  Hip flexion     Hip extension     Hip abduction     Hip adduction     Hip internal rotation     Hip external rotation     Knee flexion     Knee extension     Ankle dorsiflexion -2 6  Ankle plantarflexion     Ankle inversion     Ankle eversion      (Blank rows = not tested)   LOWER EXTREMITY MMT:   MMT Right eval Left eval  Hip flexion      Hip extension      Hip abduction      Hip adduction      Hip internal rotation      Hip external rotation      Knee flexion      Knee extension      Ankle dorsiflexion      Ankle plantarflexion      Ankle inversion      Ankle eversion       (Blank rows = not tested)   LOWER EXTREMITY SPECIAL TESTS:  DNT   FUNCTIONAL TESTS:  30 Second Sit to Stand: 8 reps SLS: R - 30 sec; L - unable   GAIT: Distance walked: 2425ft Assistive device utilized: None Level of assistance: Modified independence Comments: antalgic gait in CAM L LE     TREATMENT: [facility] Adult PT Treatment:                                                DATE: 02/15/2022 Therapeutic Exercise: Rec bike lvl 3 x 4 min while taking subjective Slant board stretch 2x45"  Standing heel raises x 25 Step ups with march 8in x 15 each Step ups lat x 10 8in L knee flex/ankle DF stretch on 8in step x 30" Tandem walk on foam x 2 laps Tandem stance L back on foam 2x30" L ankle PF 2x15 black TB L ankle DF/inv/ev 2x10 GTB Seated heel raise 2x20 25# KB L BAPS L3 cw/ccw 2x10 each STS 2x15 - low table no UE "tap" Woble board 3x10 L kickstand 2x10 KB 10# Modalities: Vasocompression   34 Low L ankle x 15 min  [facility] Adult PT Treatment:                                                DATE: 02/11/2022 Therapeutic Exercise: Rec bike lvl 3 x 4 min while taking subjective Slant board stretch 2x45"  Standing heel raises x 25 Step ups with march 8in x 10 each Step ups lat x 10 8in L knee flex/ankle DF stretch on 8in step 2x30" L ankle PF 2x15 black TB L ankle DF/inv/ev x 15 RTB BAPS L3 cw/ccw 2x10 each STS 2x10 - low table no UE Tandem stance L back on foam 2x30" L kickstand 2x10 KB 10# Tandme walk 2x7815ft Modalities: Vasocompression  34 Low L ankle x 15 min  [facility] Adult PT Treatment:  DATE: 02/04/2022 Therapeutic Exercise: Rec bike lvl 2 x 3 min while taking subjective Slant board stretch 2x45"  Standing heel raises x 20 Step ups with march 8in 2x10 L stance  L knee flex/ankle DF stretch on 8in step 2x30" L ankle PF 2x15 black TB L ankle DF/inv/ev 2x10 RTB BAPS L3 cw/ccw 2x10 each STS 2x10 - low table no UE Tandem stance L back on foam 2x30" Modalities: Vasocompression  34 Low L ankle x 15 min  [facility] Adult PT Treatment:                                                DATE: 02/02/2022 Therapeutic Exercise: NuStep lvl 5 LE only x 4 min while taking subjective Slant board 2x30"  Standing heel raises 2x15 Step ups 6in 2x10 L leading L ankle PF 2x15 black TB L ankle DF/inv/ev 2x10 YTB BAPS L2 cw/ccw 2x10 each STS 2x10 - low table no UE Tandem stance L back 2x30" Modalities: Vasocompression  34 Low L ankle x 15 min  [facility] Adult PT Treatment:                                                DATE: 01/26/2022 Therapeutic Exercise: NuStep lvl 5 LE only x 4 min while taking subjective Slant board 2x30"  Standing heel raises 2x15 BAPS L2 cw/ccw 2x10 each Long sitting calf stretch 60" L with strap L ankle 4-way YTB 2x10 each Tandem stance L back 2x30" Modalities: Vasocompression  34 Low L ankle x 15  min  [facility] Adult PT Treatment:                                                DATE: 01/25/2022 Therapeutic Exercise: NuStep lvl 4 LE only x 4 min while taking subjective Seated heel raises 2x15  Long sitting calf stretch 2x30" L with strap L ankle DF YTB 2x10 L ankle inv/ev 2x10 - PT manual hold of L tibia to assist Step ups 4in 2x10 L leading  Standing heel raise 2x10 Tandem stance L back 2x30" Modalities: Vasocompression  34 Low L ankle x 10 min  PATIENT EDUCATION:  Education details: continue HEP Person educated: Patient Education method: Explanation, Demonstration, and Handouts Education comprehension: verbalized understanding and returned demonstration   HOME EXERCISE PROGRAM: Access Code: [email or web address] URL: [email or web address] Date: 01/26/2022 Prepared by: Edwinna Areola  Exercises - Long Sitting Calf Stretch with Strap  - 3 x daily - 7 x weekly - 2 reps - 30 sec hold - Supine Ankle Pumps  - 3 x daily - 7 x weekly - 20 reps - Ankle Inversion Eversion Towel Slide  - 3 x daily - 7 x weekly - 10 reps - Standing Tandem Balance with Counter Support  - 3 x daily - 7 x weekly - 3 sets - 2-3 reps - 30 sec hold - Ankle Dorsiflexion with Resistance  - 1 x daily - 7 x weekly - 2 sets - 10 reps - yellow tband hold - Seated Ankle Inversion with Resistance and Legs Crossed  - 1 x daily - 7  x weekly - 2 sets - 10 reps - yellow tband hold - Long Sitting Ankle Eversion with Resistance  - 1 x daily - 7 x weekly - 2 sets - 10 reps - yellow tband hold - Ankle and Toe Plantarflexion with Resistance  - 1 x daily - 7 x weekly - 2 sets - 10 reps - yellow tband hold   ASSESSMENT:   CLINICAL IMPRESSION: Pt was able to complete all prescribed exericses with no adverse effect. HEP updated for improving distal LE strength and motor control of L ankle. Pt demonstrated improved strength, ROM, and mobility for L ankle today. She continues to progress well with therapy, will continue per  POC.          OBJECTIVE IMPAIRMENTS: Abnormal gait, decreased activity tolerance, decreased balance, decreased endurance, decreased mobility, difficulty walking, decreased ROM, decreased strength, and pain.    ACTIVITY LIMITATIONS: carrying, lifting, standing, squatting, stairs, transfers, and locomotion level   PARTICIPATION LIMITATIONS: driving, shopping, community activity, occupation, and yard work   PERSONAL FACTORS: None     GOALS: Goals reviewed with patient? No   SHORT TERM GOALS: Target date: 02/09/2022   Pt will be compliant and knowledgeable with initial HEP for improved comfort and carryover Baseline: initial HEP given  Goal status: INITIAL   2.  Pt will self report left ankle pain no greater than 3/10 for improved comfort and functional ability Baseline: 4/10 at worst Goal status: INITIAL    LONG TERM GOALS: Target date: 03/16/2022   Pt will self report left ankle pain no greater than 1/10 for improved comfort and functional ability Baseline: 4/10 at worst Goal status: INITIAL    2.  Pt will improve FOTO function score to no less than 76% as proxy for functional improvement Baseline: 52% function Goal status: INITIAL    3.  Pt will increase 30 Second Sit to Stand rep count to no less than 13 reps for improved balance, strength, and functional mobility Baseline: 8 reps  Goal status: INITIAL    4.  Pt will improve L SLS to no less than 30 seconds for improve functional balance and mobility Baseline: unable Goal status: INITIAL   5.  Pt will be able to push/lift 100# with no increase in ankle pain for improved comfort and function with work Baseline: unable Goal status: INITIAL     PLAN:   PT FREQUENCY: 2x/week   PT DURATION: 8 weeks   PLANNED INTERVENTIONS: Therapeutic exercises, Therapeutic activity, Neuromuscular re-education, Balance training, Gait training, Patient/Family education, Self Care, Joint mobilization, Dry Needling, Electrical stimulation,  Cryotherapy, Taping, Vasopneumatic device, Manual therapy, and Re-evaluation   PLAN FOR NEXT SESSION: assess HEP response, ankle strengthening and stability   Eloy End, PT 02/16/2022, 9:59 AM

## 2022-02-16 ENCOUNTER — Ambulatory Visit: Payer: BC Managed Care – PPO

## 2022-02-16 DIAGNOSIS — M6281 Muscle weakness (generalized): Secondary | ICD-10-CM | POA: Diagnosis not present

## 2022-02-16 DIAGNOSIS — R2681 Unsteadiness on feet: Secondary | ICD-10-CM | POA: Diagnosis not present

## 2022-02-16 DIAGNOSIS — M25572 Pain in left ankle and joints of left foot: Secondary | ICD-10-CM

## 2022-02-18 ENCOUNTER — Ambulatory Visit: Payer: BC Managed Care – PPO

## 2022-02-18 DIAGNOSIS — M25572 Pain in left ankle and joints of left foot: Secondary | ICD-10-CM

## 2022-02-18 DIAGNOSIS — M6281 Muscle weakness (generalized): Secondary | ICD-10-CM

## 2022-02-18 DIAGNOSIS — R2681 Unsteadiness on feet: Secondary | ICD-10-CM

## 2022-02-18 NOTE — Therapy (Signed)
OUTPATIENT PHYSICAL THERAPY TREATMENT NOTE   Patient Name: Alyssa Roberts MRN: [id number] DOB:12-22-1990, 31 y.o., female Today's Date: 02/18/2022  PCP: Fleet Contras, MD  REFERRING PROVIDER: Myra Rude, MD   END OF SESSION:   PT End of Session - 02/18/22 0912     Visit Number 8    Number of Visits 17    Date for PT Re-Evaluation 03/16/22    Authorization Type BC[employer]    PT Start Time 0915    PT Stop Time 0954    PT Time Calculation (min) 39 min    Activity Tolerance Patient tolerated treatment well    Behavior During Therapy [facility] for tasks assessed/performed                   Past Medical History:  Diagnosis Date   Asthma    History reviewed. No pertinent surgical history. Patient Active Problem List   Diagnosis Date Noted   Hamstring strain, left, initial encounter 12/14/2021   Contusion of bone 12/14/2021    REFERRING DIAG: T14.8XXA (ICD-10-CM) - Contusion of bone    THERAPY DIAG:  Pain in left ankle and joints of left foot  Muscle weakness (generalized)  Unsteadiness on feet  Rationale for Evaluation and Treatment Rehabilitation  PERTINENT HISTORY: None   PRECAUTIONS: None   SUBJECTIVE:                                                                                                                                                                                      SUBJECTIVE STATEMENT:  Pt presents to PT with reports of muscle soreness but no current ankle pain. Has been compliant with HEP with no adverse effect. She is ready to begin PT at this time.    PAIN:  Are you having pain?  Yes: NPRS scale: 0/10 Pain location: posterior heel, lateral L ankle Pain description: sharp, stiff Aggravating factors: walking, prolonged standing Relieving factors: elevation   OBJECTIVE: (objective measures completed at initial evaluation unless otherwise dated)  DIAGNOSTIC FINDINGS:             See imaging for recent MRI of L ankle   PATIENT  SURVEYS:  FOTO: 52% function; 76% predicted    COGNITION: Overall cognitive status: Within functional limits for tasks assessed                         SENSATION: WFL   EDEMA:  Figure 8: R: 26cm  L: 27cm   POSTURE: No Significant postural limitations   PALPATION: TTP to L lateral ankle   LOWER EXTREMITY ROM:   Active ROM Left eval Left  02/16/22  Hip flexion     Hip extension     Hip abduction     Hip adduction     Hip internal rotation     Hip external rotation     Knee flexion     Knee extension     Ankle dorsiflexion -2 6  Ankle plantarflexion     Ankle inversion     Ankle eversion      (Blank rows = not tested)   LOWER EXTREMITY MMT:   MMT Right eval Left eval  Hip flexion      Hip extension      Hip abduction      Hip adduction      Hip internal rotation      Hip external rotation      Knee flexion      Knee extension      Ankle dorsiflexion      Ankle plantarflexion      Ankle inversion      Ankle eversion       (Blank rows = not tested)   LOWER EXTREMITY SPECIAL TESTS:  DNT   FUNCTIONAL TESTS:  30 Second Sit to Stand: 8 reps SLS: R - 30 sec; L - unable   GAIT: Distance walked: 65ft Assistive device utilized: None Level of assistance: Modified independence Comments: antalgic gait in CAM L LE     TREATMENT: [facility] Adult PT Treatment:                                                DATE: 02/18/2022 Therapeutic Exercise: Rec bike lvl 3 x 4 min while taking subjective Slant board stretch 2x45"  Standing heel raises x 25 Step ups with march 8in x 15 each Step ups lat x 10 8in L knee flex/ankle DF stretch on 8in step x 30" Tandem walk on foam x 3 laps Tandem stance L back on foam 2x30" L ankle PF 2x15 black TB L ankle DF/inv/ev 2x10 GTB Seated heel raise 2x20 25# KB L BAPS L3 cw/ccw 2x10 each STS 2x15 - low table no UE "tap" Woble board 3x10 L kickstand 2x10 KB 10# Modalities: Vasocompression  34 Low L ankle x 15 min  [facility]  Adult PT Treatment:                                                DATE: 02/15/2022 Therapeutic Exercise: Rec bike lvl 3 x 4 min while taking subjective Slant board stretch 2x45"  Standing heel raises x 25 Step ups with march 8in x 15 each Step ups lat x 10 8in L knee flex/ankle DF stretch on 8in step x 30" SLS 2x30" Tandem walk on foam x 2 laps Tandem stance L back on foam 2x30" L ankle DF/inv/ev 2x10 GTB Seated heel raise 2x20 25# KB L BAPS L3 cw/ccw 2x10 each STS 2x15 - low table no UE "tap" Woble board 3x10 L kickstand 2x10 KB 10# Modalities: Vasocompression  34 Low L ankle x 15 min  [facility] Adult PT Treatment:  DATE: 02/11/2022 Therapeutic Exercise: Rec bike lvl 3 x 4 min while taking subjective Slant board stretch 2x45"  Standing heel raises x 25 Step ups with march 8in x 10 each Step ups lat x 10 8in L knee flex/ankle DF stretch on 8in step 2x30" L ankle PF 2x15 black TB L ankle DF/inv/ev x 15 RTB BAPS L3 cw/ccw 2x10 each STS 2x10 - low table no UE Tandem stance L back on foam 2x30" L kickstand 2x10 KB 10# Tandme walk 2x28ft Modalities: Vasocompression  34 Low L ankle x 15 min  [facility] Adult PT Treatment:                                                DATE: 02/04/2022 Therapeutic Exercise: Rec bike lvl 2 x 3 min while taking subjective Slant board stretch 2x45"  Standing heel raises x 20 Step ups with march 8in 2x10 L stance  L knee flex/ankle DF stretch on 8in step 2x30" L ankle PF 2x15 black TB L ankle DF/inv/ev 2x10 RTB BAPS L3 cw/ccw 2x10 each STS 2x10 - low table no UE Tandem stance L back on foam 2x30" Modalities: Vasocompression  34 Low L ankle x 15 min  [facility] Adult PT Treatment:                                                DATE: 02/02/2022 Therapeutic Exercise: NuStep lvl 5 LE only x 4 min while taking subjective Slant board 2x30"  Standing heel raises 2x15 Step ups 6in 2x10 L leading L ankle  PF 2x15 black TB L ankle DF/inv/ev 2x10 YTB BAPS L2 cw/ccw 2x10 each STS 2x10 - low table no UE Tandem stance L back 2x30" Modalities: Vasocompression  34 Low L ankle x 15 min  [facility] Adult PT Treatment:                                                DATE: 01/26/2022 Therapeutic Exercise: NuStep lvl 5 LE only x 4 min while taking subjective Slant board 2x30"  Standing heel raises 2x15 BAPS L2 cw/ccw 2x10 each Long sitting calf stretch 60" L with strap L ankle 4-way YTB 2x10 each Tandem stance L back 2x30" Modalities: Vasocompression  34 Low L ankle x 15 min  [facility] Adult PT Treatment:                                                DATE: 01/25/2022 Therapeutic Exercise: NuStep lvl 4 LE only x 4 min while taking subjective Seated heel raises 2x15  Long sitting calf stretch 2x30" L with strap L ankle DF YTB 2x10 L ankle inv/ev 2x10 - PT manual hold of L tibia to assist Step ups 4in 2x10 L leading  Standing heel raise 2x10 Tandem stance L back 2x30" Modalities: Vasocompression  34 Low L ankle x 10 min  PATIENT EDUCATION:  Education details: continue HEP Person educated: Patient Education method: Explanation, Demonstration, and Handouts  Education comprehension: verbalized understanding and returned demonstration   HOME EXERCISE PROGRAM: Access Code: [email or web address] URL: [email or web address] Date: 02/18/2022 Prepared by: Edwinna Areolaavid Tawana Pasch  Exercises - Long Sitting Calf Stretch with Strap  - 3 x daily - 7 x weekly - 2 reps - 30 sec hold - Gastroc Stretch on Wall  - 1 x daily - 7 x weekly - 2 reps - 30 sec hold - Soleus Stretch on Wall  - 1 x daily - 7 x weekly - 2 reps - 30 sec hold - Ankle Dorsiflexion with Resistance  - 1 x daily - 7 x weekly - 2 sets - 10 reps - greentband hold - Seated Ankle Inversion with Resistance and Legs Crossed  - 1 x daily - 7 x weekly - 2 sets - 10 reps - green tband hold - Long Sitting Ankle Eversion with Resistance  - 1 x daily - 7  x weekly - 2 sets - 10 reps - green tband hold - Ankle and Toe Plantarflexion with Resistance  - 1 x daily - 7 x weekly - 2 sets - 15 reps - black theraband hold - Single Leg Stance  - 1 x daily - 7 x weekly - 2-3 reps - 30 sec hold   ASSESSMENT:   CLINICAL IMPRESSION: Pt was able to complete all prescribed exericses with no adverse effect. HEP updated for improving distal LE strength and motor control of L ankle. HEP updated for improved ankle strengthening and balance. Pt demonstrated improved strength, ROM, and mobility for L ankle today. She continues to progress well with therapy, will continue per POC.   OBJECTIVE IMPAIRMENTS: Abnormal gait, decreased activity tolerance, decreased balance, decreased endurance, decreased mobility, difficulty walking, decreased ROM, decreased strength, and pain.    ACTIVITY LIMITATIONS: carrying, lifting, standing, squatting, stairs, transfers, and locomotion level   PARTICIPATION LIMITATIONS: driving, shopping, community activity, occupation, and yard work   PERSONAL FACTORS: None     GOALS: Goals reviewed with patient? No   SHORT TERM GOALS: Target date: 02/09/2022   Pt will be compliant and knowledgeable with initial HEP for improved comfort and carryover Baseline: initial HEP given  Goal status: INITIAL   2.  Pt will self report left ankle pain no greater than 3/10 for improved comfort and functional ability Baseline: 4/10 at worst Goal status: INITIAL    LONG TERM GOALS: Target date: 03/16/2022   Pt will self report left ankle pain no greater than 1/10 for improved comfort and functional ability Baseline: 4/10 at worst Goal status: INITIAL    2.  Pt will improve FOTO function score to no less than 76% as proxy for functional improvement Baseline: 52% function Goal status: INITIAL    3.  Pt will increase 30 Second Sit to Stand rep count to no less than 13 reps for improved balance, strength, and functional mobility Baseline: 8 reps   Goal status: INITIAL    4.  Pt will improve L SLS to no less than 30 seconds for improve functional balance and mobility Baseline: unable Goal status: INITIAL   5.  Pt will be able to push/lift 100# with no increase in ankle pain for improved comfort and function with work Baseline: unable Goal status: INITIAL     PLAN:   PT FREQUENCY: 2x/week   PT DURATION: 8 weeks   PLANNED INTERVENTIONS: Therapeutic exercises, Therapeutic activity, Neuromuscular re-education, Balance training, Gait training, Patient/Family education, Self Care, Joint mobilization, Dry Needling, Electrical stimulation, Cryotherapy, Taping, Vasopneumatic device, Manual therapy,  and Re-evaluation   PLAN FOR NEXT SESSION: assess HEP response, ankle strengthening and stability   Eloy End, PT 02/18/2022, 9:57 AM

## 2022-03-02 NOTE — Therapy (Incomplete)
OUTPATIENT PHYSICAL THERAPY TREATMENT NOTE   Patient Name: Alyssa Roberts MRN: [id number] DOB:1991-02-28, 32 y.o., female Today's Date: 03/02/2022  PCP: Nolene Ebbs, MD  REFERRING PROVIDER: Rosemarie Ax, MD   END OF SESSION:           Past Medical History:  Diagnosis Date   Asthma    No past surgical history on file. Patient Active Problem List   Diagnosis Date Noted   Hamstring strain, left, initial encounter 12/14/2021   Contusion of bone 12/14/2021    REFERRING DIAG: T14.8XXA (ICD-10-CM) - Contusion of bone    THERAPY DIAG:  No diagnosis found.  Rationale for Evaluation and Treatment Rehabilitation  PERTINENT HISTORY: None   PRECAUTIONS: None   SUBJECTIVE:                                                                                                                                                                                      SUBJECTIVE STATEMENT:  ***    PAIN:  Are you having pain?  Yes: NPRS scale: 0/10 Pain location: posterior heel, lateral L ankle Pain description: sharp, stiff Aggravating factors: walking, prolonged standing Relieving factors: elevation   OBJECTIVE: (objective measures completed at initial evaluation unless otherwise dated)  DIAGNOSTIC FINDINGS:             See imaging for recent MRI of L ankle   PATIENT SURVEYS:  FOTO: 52% function; 76% predicted    COGNITION: Overall cognitive status: Within functional limits for tasks assessed                         SENSATION: WFL   EDEMA:  Figure 8: R: 26cm  L: 27cm   POSTURE: No Significant postural limitations   PALPATION: TTP to L lateral ankle   LOWER EXTREMITY ROM:   Active ROM Left eval Left 02/16/22  Hip flexion     Hip extension     Hip abduction     Hip adduction     Hip internal rotation     Hip external rotation     Knee flexion     Knee extension     Ankle dorsiflexion -2 6  Ankle plantarflexion     Ankle inversion     Ankle eversion       (Blank rows = not tested)   LOWER EXTREMITY MMT:   MMT Right eval Left eval  Hip flexion      Hip extension      Hip abduction      Hip adduction      Hip internal rotation      Hip external  rotation      Knee flexion      Knee extension      Ankle dorsiflexion      Ankle plantarflexion      Ankle inversion      Ankle eversion       (Blank rows = not tested)   LOWER EXTREMITY SPECIAL TESTS:  DNT   FUNCTIONAL TESTS:  30 Second Sit to Stand: 8 reps SLS: R - 30 sec; L - unable   GAIT: Distance walked: 77ft Assistive device utilized: None Level of assistance: Modified independence Comments: antalgic gait in CAM L LE     TREATMENT: [facility] Adult PT Treatment:                                                DATE: 03/03/2022 Therapeutic Exercise: Rec bike lvl 3 x 4 min while taking subjective Slant board stretch 2x45"  Standing heel raises x 25 Step ups with march 8in x 15 each Step ups lat x 10 8in L knee flex/ankle DF stretch on 8in step x 30" Tandem walk on foam x 3 laps Tandem stance L back on foam 2x30" L ankle PF 2x15 black TB L ankle DF/inv/ev 2x10 GTB Seated heel raise 2x20 25# KB L BAPS L3 cw/ccw 2x10 each STS 2x15 - low table no UE "tap" Woble board 3x10 L kickstand 2x10 KB 10# Modalities: Vasocompression  34 Low L ankle x 15 min  [facility] Adult PT Treatment:                                                DATE: 02/18/2022 Therapeutic Exercise: Rec bike lvl 3 x 4 min while taking subjective Slant board stretch 2x45"  Standing heel raises x 25 Step ups with march 8in x 15 each Step ups lat x 10 8in L knee flex/ankle DF stretch on 8in step x 30" Tandem walk on foam x 3 laps Tandem stance L back on foam 2x30" L ankle PF 2x15 black TB L ankle DF/inv/ev 2x10 GTB Seated heel raise 2x20 25# KB L BAPS L3 cw/ccw 2x10 each STS 2x15 - low table no UE "tap" Woble board 3x10 L kickstand 2x10 KB 10# Modalities: Vasocompression  34 Low L ankle x 15  min  [facility] Adult PT Treatment:                                                DATE: 02/15/2022 Therapeutic Exercise: Rec bike lvl 3 x 4 min while taking subjective Slant board stretch 2x45"  Standing heel raises x 25 Step ups with march 8in x 15 each Step ups lat x 10 8in L knee flex/ankle DF stretch on 8in step x 30" SLS 2x30" Tandem walk on foam x 2 laps Tandem stance L back on foam 2x30" L ankle DF/inv/ev 2x10 GTB Seated heel raise 2x20 25# KB L BAPS L3 cw/ccw 2x10 each STS 2x15 - low table no UE "tap" Woble board 3x10 L kickstand 2x10 KB 10# Modalities: Vasocompression  34 Low L ankle x 15 min  [facility] Adult PT Treatment:  DATE: 02/11/2022 Therapeutic Exercise: Rec bike lvl 3 x 4 min while taking subjective Slant board stretch 2x45"  Standing heel raises x 25 Step ups with march 8in x 10 each Step ups lat x 10 8in L knee flex/ankle DF stretch on 8in step 2x30" L ankle PF 2x15 black TB L ankle DF/inv/ev x 15 RTB BAPS L3 cw/ccw 2x10 each STS 2x10 - low table no UE Tandem stance L back on foam 2x30" L kickstand 2x10 KB 10# Tandme walk 2x49ft Modalities: Vasocompression  34 Low L ankle x 15 min  [facility] Adult PT Treatment:                                                DATE: 02/04/2022 Therapeutic Exercise: Rec bike lvl 2 x 3 min while taking subjective Slant board stretch 2x45"  Standing heel raises x 20 Step ups with march 8in 2x10 L stance  L knee flex/ankle DF stretch on 8in step 2x30" L ankle PF 2x15 black TB L ankle DF/inv/ev 2x10 RTB BAPS L3 cw/ccw 2x10 each STS 2x10 - low table no UE Tandem stance L back on foam 2x30" Modalities: Vasocompression  34 Low L ankle x 15 min  [facility] Adult PT Treatment:                                                DATE: 02/02/2022 Therapeutic Exercise: NuStep lvl 5 LE only x 4 min while taking subjective Slant board 2x30"  Standing heel raises 2x15 Step ups 6in 2x10 L  leading L ankle PF 2x15 black TB L ankle DF/inv/ev 2x10 YTB BAPS L2 cw/ccw 2x10 each STS 2x10 - low table no UE Tandem stance L back 2x30" Modalities: Vasocompression  34 Low L ankle x 15 min  [facility] Adult PT Treatment:                                                DATE: 01/26/2022 Therapeutic Exercise: NuStep lvl 5 LE only x 4 min while taking subjective Slant board 2x30"  Standing heel raises 2x15 BAPS L2 cw/ccw 2x10 each Long sitting calf stretch 60" L with strap L ankle 4-way YTB 2x10 each Tandem stance L back 2x30" Modalities: Vasocompression  34 Low L ankle x 15 min  [facility] Adult PT Treatment:                                                DATE: 01/25/2022 Therapeutic Exercise: NuStep lvl 4 LE only x 4 min while taking subjective Seated heel raises 2x15  Long sitting calf stretch 2x30" L with strap L ankle DF YTB 2x10 L ankle inv/ev 2x10 - PT manual hold of L tibia to assist Step ups 4in 2x10 L leading  Standing heel raise 2x10 Tandem stance L back 2x30" Modalities: Vasocompression  34 Low L ankle x 10 min  PATIENT EDUCATION:  Education details: continue HEP Person educated: Patient Education method: Explanation, Demonstration, and Handouts  Education comprehension: verbalized understanding and returned demonstration   HOME EXERCISE PROGRAM: Access Code: [email or web address] URL: [email or web address] Date: 02/18/2022 Prepared by: Octavio Manns  Exercises - Long Sitting Calf Stretch with Strap  - 3 x daily - 7 x weekly - 2 reps - 30 sec hold - Gastroc Stretch on Wall  - 1 x daily - 7 x weekly - 2 reps - 30 sec hold - Soleus Stretch on Wall  - 1 x daily - 7 x weekly - 2 reps - 30 sec hold - Ankle Dorsiflexion with Resistance  - 1 x daily - 7 x weekly - 2 sets - 10 reps - greentband hold - Seated Ankle Inversion with Resistance and Legs Crossed  - 1 x daily - 7 x weekly - 2 sets - 10 reps - green tband hold - Long Sitting Ankle Eversion with Resistance   - 1 x daily - 7 x weekly - 2 sets - 10 reps - green tband hold - Ankle and Toe Plantarflexion with Resistance  - 1 x daily - 7 x weekly - 2 sets - 15 reps - black theraband hold - Single Leg Stance  - 1 x daily - 7 x weekly - 2-3 reps - 30 sec hold   ASSESSMENT:   CLINICAL IMPRESSION: ***   OBJECTIVE IMPAIRMENTS: Abnormal gait, decreased activity tolerance, decreased balance, decreased endurance, decreased mobility, difficulty walking, decreased ROM, decreased strength, and pain.    ACTIVITY LIMITATIONS: carrying, lifting, standing, squatting, stairs, transfers, and locomotion level   PARTICIPATION LIMITATIONS: driving, shopping, community activity, occupation, and yard work   PERSONAL FACTORS: None     GOALS: Goals reviewed with patient? No   SHORT TERM GOALS: Target date: 02/09/2022   Pt will be compliant and knowledgeable with initial HEP for improved comfort and carryover Baseline: initial HEP given  Goal status: INITIAL   2.  Pt will self report left ankle pain no greater than 3/10 for improved comfort and functional ability Baseline: 4/10 at worst Goal status: INITIAL    LONG TERM GOALS: Target date: 03/16/2022   Pt will self report left ankle pain no greater than 1/10 for improved comfort and functional ability Baseline: 4/10 at worst Goal status: INITIAL    2.  Pt will improve FOTO function score to no less than 76% as proxy for functional improvement Baseline: 52% function Goal status: INITIAL    3.  Pt will increase 30 Second Sit to Stand rep count to no less than 13 reps for improved balance, strength, and functional mobility Baseline: 8 reps  Goal status: INITIAL    4.  Pt will improve L SLS to no less than 30 seconds for improve functional balance and mobility Baseline: unable Goal status: INITIAL   5.  Pt will be able to push/lift 100# with no increase in ankle pain for improved comfort and function with work Baseline: unable Goal status: INITIAL      PLAN:   PT FREQUENCY: 2x/week   PT DURATION: 8 weeks   PLANNED INTERVENTIONS: Therapeutic exercises, Therapeutic activity, Neuromuscular re-education, Balance training, Gait training, Patient/Family education, Self Care, Joint mobilization, Dry Needling, Electrical stimulation, Cryotherapy, Taping, Vasopneumatic device, Manual therapy, and Re-evaluation   PLAN FOR NEXT SESSION: assess HEP response, ankle strengthening and stability   Ward Chatters, PT 03/02/2022, 8:42 AM

## 2022-03-03 ENCOUNTER — Ambulatory Visit: Payer: BC Managed Care – PPO | Attending: Family Medicine

## 2022-03-03 DIAGNOSIS — M6281 Muscle weakness (generalized): Secondary | ICD-10-CM | POA: Insufficient documentation

## 2022-03-03 DIAGNOSIS — R2681 Unsteadiness on feet: Secondary | ICD-10-CM | POA: Insufficient documentation

## 2022-03-03 DIAGNOSIS — M25572 Pain in left ankle and joints of left foot: Secondary | ICD-10-CM | POA: Insufficient documentation

## 2022-03-08 ENCOUNTER — Ambulatory Visit: Payer: BC Managed Care – PPO

## 2022-03-08 DIAGNOSIS — M6281 Muscle weakness (generalized): Secondary | ICD-10-CM | POA: Diagnosis not present

## 2022-03-08 DIAGNOSIS — R2681 Unsteadiness on feet: Secondary | ICD-10-CM

## 2022-03-08 DIAGNOSIS — M25572 Pain in left ankle and joints of left foot: Secondary | ICD-10-CM

## 2022-03-08 NOTE — Therapy (Addendum)
OUTPATIENT PHYSICAL THERAPY TREATMENT NOTE/DISCHARGE  PHYSICAL THERAPY DISCHARGE SUMMARY  Visits from Start of Care: 9  Current functional level related to goals / functional outcomes: See goals/objective   Remaining deficits: See goals/objective   Education / Equipment: HEP   Patient agrees to discharge. Patient goals were met. Patient is being discharged due to meeting the stated rehab goals.   Patient Name: Alyssa Roberts MRN: [id number] DOB:02/11/1991, 32 y.o., female Today's Date: 04/20/2022  PCP: Nolene Ebbs, MD  REFERRING PROVIDER: Rosemarie Ax, MD   END OF SESSION:            Past Medical History:  Diagnosis Date   Asthma    History reviewed. No pertinent surgical history. Patient Active Problem List   Diagnosis Date Noted   Hamstring strain, left, initial encounter 12/14/2021   Contusion of bone 12/14/2021    REFERRING DIAG: T14.8XXA (ICD-10-CM) - Contusion of bone    THERAPY DIAG:  Pain in left ankle and joints of left foot  Muscle weakness (generalized)  Unsteadiness on feet  Rationale for Evaluation and Treatment Rehabilitation  PERTINENT HISTORY: None   PRECAUTIONS: None   SUBJECTIVE:                                                                                                                                                                                      SUBJECTIVE STATEMENT:  Pt presents to PT with no current pain reported in L ankle. Has continued with HEP compliance with no adverse effect. Is ready to begin PT at this time.     PAIN:  Are you having pain?  Yes: NPRS scale: 0/10 Pain location: posterior heel, lateral L ankle Pain description: sharp, stiff Aggravating factors: walking, prolonged standing Relieving factors: elevation   OBJECTIVE: (objective measures completed at initial evaluation unless otherwise dated)  DIAGNOSTIC FINDINGS:             See imaging for recent MRI of L ankle   PATIENT  SURVEYS:  FOTO: 99% function; 76% predicted - 03/08/2022   COGNITION: Overall cognitive status: Within functional limits for tasks assessed                         SENSATION: WFL   EDEMA:  Figure 8: R: 26cm  L: 27cm   POSTURE: No Significant postural limitations   PALPATION: TTP to L lateral ankle   LOWER EXTREMITY ROM:   Active ROM Left eval Left 02/16/22 Left 03/08/2022  Hip flexion      Hip extension      Hip abduction      Hip adduction  Hip internal rotation      Hip external rotation      Knee flexion      Knee extension      Ankle dorsiflexion -2 6 8  $ Ankle plantarflexion      Ankle inversion      Ankle eversion       (Blank rows = not tested)   LOWER EXTREMITY MMT:   MMT Right eval Left eval  Hip flexion      Hip extension      Hip abduction      Hip adduction      Hip internal rotation      Hip external rotation      Knee flexion      Knee extension      Ankle dorsiflexion      Ankle plantarflexion      Ankle inversion      Ankle eversion       (Blank rows = not tested)   LOWER EXTREMITY SPECIAL TESTS:  DNT   FUNCTIONAL TESTS:  30 Second Sit to Stand: 19 reps  - 03/08/2022 SLS: L - 30 sec  - 03/08/2022 Push/Pull: able to push and pull 100# sled no pain  - 03/08/2022   GAIT: Distance walked: 57f Assistive device utilized: None Level of assistance: Modified independence Comments: antalgic gait in CAM L LE     TREATMENT: [facility] Adult PT Treatment:                                                DATE: 03/08/2022 Therapeutic Exercise: Rec bike lvl 3 x 4 min while taking subjective Standing gastroc/soleus stretch x 60" each L Standing heel raise with ball 2x15 SLS 2x30" L Longsitting calf stretch x 60" L L ankle PF 2x15 black TB L ankle DF/inv/ev 2x15 GTB Therapeutic Activity: Assessment of tests/measures, goals, and outcomes for discharge  [facility]                                                [facility]:  02[facility] Therapeutic Exercise: Rec bike lvl 3 x 4 min while taking subjective Slant board stretch 2x45"  Standing heel raises x 25 Step ups with march 8in x 15 each Step ups lat x 10 8in L knee flex/ankle DF stretch on 8in step x 30" Tandem walk on foam x 3 laps Tandem stance L back on foam 2x30" L ankle PF 2x15 black TB L ankle DF/inv/ev 2x10 GTB Seated heel raise 2x20 25# KB L BAPS L3 cw/ccw 2x10 each STS 2x15 - low table no UE "tap" Woble board 3x10 L kickstand 2x10 KB 10# Modalities: Vasocompression  34 Low L ankle x 15 min  [facility] Adult PT Treatment:                                                DATE: 02/15/2022 Therapeutic Exercise: Rec bike lvl 3 x 4 min while taking subjective Slant board stretch 2x45"  Standing heel raises x 25 Step ups with march 8in x 15 each Step ups lat x 10  8in L knee flex/ankle DF stretch on 8in step x 30" SLS 2x30" Tandem walk on foam x 2 laps Tandem stance L back on foam 2x30" L ankle DF/inv/ev 2x10 GTB Seated heel raise 2x20 25# KB L BAPS L3 cw/ccw 2x10 each STS 2x15 - low table no UE "tap" Woble board 3x10 L kickstand 2x10 KB 10# Modalities: Vasocompression  34 Low L ankle x 15 min  PATIENT EDUCATION:  Education details: HEP and discharge plan Person educated: Patient Education method: Explanation, Demonstration, and Handouts Education comprehension: verbalized understanding and returned demonstration   HOME EXERCISE PROGRAM: Access Code: [email or web address] URL: [email or web address] Date: 02/18/2022 Prepared by: Octavio Manns  Exercises - Long Sitting Calf Stretch with Strap  - 3 x daily - 7 x weekly - 2 reps - 30 sec hold - Gastroc Stretch on Wall  - 1 x daily - 7 x weekly - 2 reps - 30 sec hold - Soleus Stretch on Wall  - 1 x daily - 7 x weekly - 2 reps - 30 sec hold - Ankle Dorsiflexion with Resistance  - 1 x daily - 7 x weekly - 2 sets - 10 reps - greentband hold - Seated Ankle Inversion with Resistance and  Legs Crossed  - 1 x daily - 7 x weekly - 2 sets - 10 reps - green tband hold - Long Sitting Ankle Eversion with Resistance  - 1 x daily - 7 x weekly - 2 sets - 10 reps - green tband hold - Ankle and Toe Plantarflexion with Resistance  - 1 x daily - 7 x weekly - 2 sets - 15 reps - black theraband hold - Single Leg Stance  - 1 x daily - 7 x weekly - 2-3 reps - 30 sec hold   ASSESSMENT:   CLINICAL IMPRESSION: Pt was able to complete all prescribed exercises and demonstrated knowledge of HEP with no adverse effect. Over the course of PT treatment she has progressed very well, showing improved L ankle ROM, strength, and balance. She noted great improvement in subjective functional ability assessed via FOTO and feels she is in a good place to discharge from therapy. She has met all LTGs and is ready to discharge from skilled PT.   OBJECTIVE IMPAIRMENTS: Abnormal gait, decreased activity tolerance, decreased balance, decreased endurance, decreased mobility, difficulty walking, decreased ROM, decreased strength, and pain.    ACTIVITY LIMITATIONS: carrying, lifting, standing, squatting, stairs, transfers, and locomotion level   PARTICIPATION LIMITATIONS: driving, shopping, community activity, occupation, and yard work   PERSONAL FACTORS: None     GOALS: Goals reviewed with patient? No   SHORT TERM GOALS: Target date: 02/09/2022   Pt will be compliant and knowledgeable with initial HEP for improved comfort and carryover Baseline: initial HEP given  Goal status: MET   2.  Pt will self report left ankle pain no greater than 3/10 for improved comfort and functional ability Baseline: 4/10 at worst Goal status: MET   LONG TERM GOALS: Target date: 03/16/2022   Pt will self report left ankle pain no greater than 1/10 for improved comfort and functional ability Baseline: 4/10 at worst Goal status: MET   2.  Pt will improve FOTO function score to no less than 76% as proxy for functional  improvement Baseline: 52% function 03/08/2022: 99% function Goal status: INITIAL    3.  Pt will increase 30 Second Sit to Stand rep count to no less than 13 reps for improved balance, strength, and functional mobility Baseline: 8  reps  03/08/2022: 19 reps Goal status: MET   4.  Pt will improve L SLS to no less than 30 seconds for improve functional balance and mobility Baseline: unable 03/08/2022: 30" L Goal status: MET   5.  Pt will be able to push/lift 100# with no increase in ankle pain for improved comfort and function with work Baseline: unable 03/08/2022: able with no pain Goal status: MET     PLAN:   PT FREQUENCY: 2x/week   PT DURATION: 8 weeks   PLANNED INTERVENTIONS: Therapeutic exercises, Therapeutic activity, Neuromuscular re-education, Balance training, Gait training, Patient/Family education, Self Care, Joint mobilization, Dry Needling, Electrical stimulation, Cryotherapy, Taping, Vasopneumatic device, Manual therapy, and Re-evaluation   PLAN FOR NEXT SESSION: assess HEP response, ankle strengthening and stability   Ward Chatters, PT 04/20/2022, 1:38 PM

## 2022-03-10 ENCOUNTER — Ambulatory Visit: Payer: BC Managed Care – PPO

## 2022-04-05 ENCOUNTER — Emergency Department (HOSPITAL_BASED_OUTPATIENT_CLINIC_OR_DEPARTMENT_OTHER)
Admission: EM | Admit: 2022-04-05 | Discharge: 2022-04-06 | Disposition: A | Discharge status: Home / Self Care | Payer: BC Managed Care – PPO | Attending: Emergency Medicine | Admitting: Emergency Medicine

## 2022-04-05 ENCOUNTER — Emergency Department (HOSPITAL_BASED_OUTPATIENT_CLINIC_OR_DEPARTMENT_OTHER): Payer: BC Managed Care – PPO

## 2022-04-05 ENCOUNTER — Other Ambulatory Visit: Payer: Self-pay

## 2022-04-05 ENCOUNTER — Encounter (HOSPITAL_BASED_OUTPATIENT_CLINIC_OR_DEPARTMENT_OTHER): Payer: Self-pay | Admitting: Emergency Medicine

## 2022-04-05 DIAGNOSIS — M549 Dorsalgia, unspecified: Secondary | ICD-10-CM | POA: Diagnosis not present

## 2022-04-05 DIAGNOSIS — M419 Scoliosis, unspecified: Secondary | ICD-10-CM | POA: Diagnosis not present

## 2022-04-05 DIAGNOSIS — S3992XA Unspecified injury of lower back, initial encounter: Secondary | ICD-10-CM | POA: Diagnosis not present

## 2022-04-05 DIAGNOSIS — X500XXA Overexertion from strenuous movement or load, initial encounter: Secondary | ICD-10-CM | POA: Diagnosis not present

## 2022-04-05 DIAGNOSIS — S39012A Strain of muscle, fascia and tendon of lower back, initial encounter: Secondary | ICD-10-CM

## 2022-04-05 LAB — PREGNANCY, URINE: Preg Test, Ur: NEGATIVE

## 2022-04-05 MED ORDER — KETOROLAC TROMETHAMINE 60 MG/2ML IM SOLN
60.0000 mg | Freq: Once | INTRAMUSCULAR | Status: AC
  Administered 2022-04-05: 60 mg via INTRAMUSCULAR
  Filled 2022-04-05: qty 2

## 2022-04-05 NOTE — ED Triage Notes (Signed)
Pt was trying to lift chair up and it slipped and fell onto her back- lumbar. Denies numbness and tingling. Ambulated without distress.

## 2022-04-06 MED ORDER — METAXALONE 800 MG PO TABS: 800.0000 mg | ORAL_TABLET | Freq: Three times a day (TID) | ORAL | 0 refills | Status: DC

## 2022-04-06 MED ORDER — LIDOCAINE 5 % EX PTCH: 1.0000 | MEDICATED_PATCH | CUTANEOUS | 0 refills | Status: DC

## 2022-04-06 MED ORDER — MELOXICAM 15 MG PO TABS: 15.0000 mg | ORAL_TABLET | Freq: Every day | ORAL | 0 refills | Status: DC

## 2022-04-06 NOTE — ED Notes (Signed)
Discharge paperwork reviewed entirely with patient, including Rx's and follow up care. Pain was under control. Pt verbalized understanding as well as all parties involved. No questions or concerns voiced at the time of discharge. No acute distress noted.   Pt ambulated out to PVA without incident or assistance.  

## 2022-04-06 NOTE — ED Provider Notes (Signed)
[name] Provider Note   CSN: [id number] Arrival date & time: 04/05/22  2251     History  Chief Complaint  Patient presents with   Back Pain    Alyssa Roberts is a 32 y.o. female.  The history is provided by the patient.  Back Pain Location:  Lumbar spine Radiates to:  Does not radiate Pain severity:  Severe Pain is:  Same all the time Onset quality:  Sudden Timing:  Constant Progression:  Unchanged Chronicity:  New Context comment:  Lifting a heavy couch Relieved by:  Nothing Worsened by:  Nothing Ineffective treatments:  None tried Associated symptoms: no fever, no paresthesias, no pelvic pain, no perianal numbness and no weakness   Risk factors: no hx of cancer   Lifting a couch and it slipped.  No gait problems no difficulty with urination.       Home Medications Prior to Admission medications   Medication Sig Start Date End Date Taking? Authorizing Provider  lidocaine (LIDODERM) 5 % Place 1 patch onto the skin daily. Remove & Discard patch within 12 hours or as directed by MD 04/06/22  Yes Darlyne Schmiesing, MD  meloxicam (MOBIC) 15 MG tablet Take 1 tablet (15 mg total) by mouth daily. 04/06/22  Yes Alandria Butkiewicz, MD  metaxalone (SKELAXIN) 800 MG tablet Take 1 tablet (800 mg total) by mouth 3 (three) times daily. 04/06/22  Yes Timiya Howells, MD  albuterol (VENTOLIN HFA) 108 (90 Base) MCG/ACT inhaler Inhale 1-2 puffs into the lungs every 6 (six) hours as needed for wheezing or shortness of breath. 03/02/20   Margarette Canada, NP  meloxicam (MOBIC) 7.5 MG tablet Take 1 tablet (7.5 mg total) by mouth daily. 12/13/21   Long, Wonda Olds, MD  naproxen (NAPROSYN) 500 MG tablet Take 1 tablet (500 mg total) by mouth 2 (two) times daily with a meal. 09/21/21   Neythan Kozlov, MD  Spacer/Aero-Holding Chambers (AEROCHAMBER MV) inhaler Use as instructed 03/02/20   Margarette Canada, NP      Allergies    Prednisone and Tramadol    Review of Systems    Review of Systems  Constitutional:  Negative for fever.  HENT:  Negative for facial swelling.   Genitourinary:  Negative for difficulty urinating and pelvic pain.  Musculoskeletal:  Positive for back pain.  Neurological:  Negative for weakness and paresthesias.  All other systems reviewed and are negative.   Physical Exam Updated Vital Signs BP 112/77 (BP Location: Left Arm)   Pulse 70   Temp 98.2 F (36.8 C)   Resp 18   LMP 03/04/2022   SpO2 99%  Physical Exam Vitals and nursing note reviewed.  Constitutional:      General: She is not in acute distress.    Appearance: Normal appearance. She is well-developed.  HENT:     Head: Normocephalic and atraumatic.     Nose: Nose normal.  Eyes:     Pupils: Pupils are equal, round, and reactive to light.  Cardiovascular:     Rate and Rhythm: Normal rate and regular rhythm.     Pulses: Normal pulses.     Heart sounds: Normal heart sounds.  Pulmonary:     Effort: Pulmonary effort is normal. No respiratory distress.     Breath sounds: Normal breath sounds.  Abdominal:     General: Bowel sounds are normal. There is no distension.     Palpations: Abdomen is soft.     Tenderness: There is  no abdominal tenderness. There is no guarding or rebound.  Genitourinary:    Vagina: No vaginal discharge.  Musculoskeletal:        General: Normal range of motion.     Cervical back: Normal and neck supple.     Thoracic back: Normal.     Lumbar back: No swelling, edema, signs of trauma, spasms, tenderness or bony tenderness. Normal range of motion. No scoliosis.  Skin:    General: Skin is warm and dry.     Capillary Refill: Capillary refill takes less than 2 seconds.     Findings: No erythema or rash.  Neurological:     General: No focal deficit present.     Mental Status: She is alert and oriented to person, place, and time.     Deep Tendon Reflexes: Reflexes normal.  Psychiatric:        Mood and Affect: Mood normal.        Behavior:  Behavior normal.     ED Results / Procedures / Treatments   Labs (all labs ordered are listed, but only abnormal results are displayed) Labs Reviewed  PREGNANCY, URINE    EKG None  Radiology DG Lumbar Spine Complete  Result Date: 04/06/2022 CLINICAL DATA:  Back pain. EXAM: LUMBAR SPINE - COMPLETE 4+ VIEW COMPARISON:  07/09/2019 FINDINGS: There are 5 non-rib-bearing lumbar vertebra. Similar broad-based levo scoliotic curvature. Mild straightening of normal lordosis. No listhesis. Normal vertebral body heights. The disc spaces are preserved. No evidence of fracture focal bone abnormalities. No visible pars defects. The sacroiliac joints are congruent. IMPRESSION: 1. Straightening of normal lordosis may represent muscle spasm. 2. No other acute findings. Chronic broad-based scoliotic curvature. Electronically Signed   By: Keith Rake M.D.   On: 04/06/2022 00:00    Procedures Procedures    Medications Ordered in ED Medications  ketorolac (TORADOL) injection 60 mg (60 mg Intramuscular Given 04/05/22 2357)    ED Course/ Medical Decision Making/ A&P                             Medical Decision Making Patient with back pain post lifting a couch   Problems Addressed: Scoliosis, unspecified scoliosis type, unspecified spinal region: chronic illness or injury    Details: Patient informed of this finding verbally and in writing.  Verbalizes understanding and agrees to follow up with PMD for ongoing care.   Strain of lumbar region, initial encounter: acute illness or injury    Details: Work note given. RX given.    Amount and/or Complexity of Data Reviewed External Data Reviewed: notes.    Details: Previous notes reviewed  Labs: ordered.    Details: Pregnancy is negative  Radiology: ordered and independent interpretation performed.    Details: Scoliosis but no fractures noted   Risk Prescription drug management. Risk Details: Well appearing.  Heat, NSAIDs and muscle relaxants.   Work note given.  Gait is intact.  Stable for discharge.  Strict return.      Final Clinical Impression(s) / ED Diagnoses Final diagnoses:  Scoliosis, unspecified scoliosis type, unspecified spinal region  Strain of lumbar region, initial encounter   Return for intractable cough, coughing up blood, fevers > 100.4 unrelieved by medication, shortness of breath, intractable vomiting, chest pain, shortness of breath, weakness, numbness, changes in speech, facial asymmetry, abdominal pain, passing out, Inability to tolerate liquids or food, cough, altered mental status or any concerns. No signs of systemic illness or infection. The  patient is nontoxic-appearing on exam and vital signs are within normal limits.  I have reviewed the triage vital signs and the nursing notes. Pertinent labs & imaging results that were available during my care of the patient were reviewed by me and considered in my medical decision making (see chart for details). After history, exam, and medical workup I feel the patient has been appropriately medically screened and is safe for discharge home. Pertinent diagnoses were discussed with the patient. Patient was given return precautions. Rx / DC Orders ED Discharge Orders          Ordered    metaxalone (SKELAXIN) 800 MG tablet  3 times daily        04/06/22 0009    meloxicam (MOBIC) 15 MG tablet  Daily        04/06/22 0009    lidocaine (LIDODERM) 5 %  Every 24 hours        04/06/22 0009              Elif Yonts, MD 04/06/22 9381

## 2022-04-07 ENCOUNTER — Encounter (HOSPITAL_BASED_OUTPATIENT_CLINIC_OR_DEPARTMENT_OTHER): Payer: Self-pay | Admitting: Emergency Medicine

## 2022-04-07 ENCOUNTER — Other Ambulatory Visit: Payer: Self-pay

## 2022-04-07 DIAGNOSIS — M545 Low back pain, unspecified: Secondary | ICD-10-CM | POA: Insufficient documentation

## 2022-04-07 DIAGNOSIS — J45909 Unspecified asthma, uncomplicated: Secondary | ICD-10-CM | POA: Insufficient documentation

## 2022-04-07 NOTE — ED Triage Notes (Signed)
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

## 2022-04-08 ENCOUNTER — Emergency Department (HOSPITAL_BASED_OUTPATIENT_CLINIC_OR_DEPARTMENT_OTHER)
Admission: EM | Admit: 2022-04-08 | Discharge: 2022-04-08 | Disposition: A | Discharge status: Home / Self Care | Payer: BC Managed Care – PPO | Attending: Emergency Medicine | Admitting: Emergency Medicine

## 2022-04-08 DIAGNOSIS — M545 Low back pain, unspecified: Secondary | ICD-10-CM

## 2022-04-08 NOTE — ED Provider Notes (Signed)
[facility] Provider Note   CSN: [id number] Arrival date & time: 04/07/22  2302     History  Chief Complaint  Patient presents with   Back Pain    Alyssa Roberts is a 32 y.o. female.  Patient is a 32 year old female with history of asthma.  Patient presenting with complaints of low back pain and requesting a work note.  She was seen here 2 days ago, but needs a note stating what she can and cannot do as requested by her employer.  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  She denies any worsening of her symptoms.  The history is provided by the patient.       Home Medications Prior to Admission medications   Medication Sig Start Date End Date Taking? Authorizing Provider  albuterol (VENTOLIN HFA) 108 (90 Base) MCG/ACT inhaler Inhale 1-2 puffs into the lungs every 6 (six) hours as needed for wheezing or shortness of breath. 03/02/20   Margarette Canada, NP  lidocaine (LIDODERM) 5 % Place 1 patch onto the skin daily. Remove & Discard patch within 12 hours or as directed by MD 04/06/22   Randal Buba, April, MD  meloxicam (MOBIC) 15 MG tablet Take 1 tablet (15 mg total) by mouth daily. 04/06/22   Palumbo, April, MD  meloxicam (MOBIC) 7.5 MG tablet Take 1 tablet (7.5 mg total) by mouth daily. 12/13/21   Long, Wonda Olds, MD  metaxalone (SKELAXIN) 800 MG tablet Take 1 tablet (800 mg total) by mouth 3 (three) times daily. 04/06/22   Palumbo, April, MD  naproxen (NAPROSYN) 500 MG tablet Take 1 tablet (500 mg total) by mouth 2 (two) times daily with a meal. 09/21/21   Palumbo, April, MD  Spacer/Aero-Holding Chambers (AEROCHAMBER MV) inhaler Use as instructed 03/02/20   Margarette Canada, NP      Allergies    Prednisone and Tramadol    Review of Systems   Review of Systems  All other systems reviewed and are negative.   Physical Exam Updated Vital Signs BP (!) 142/69 (BP Location: Right Arm)   Pulse 74   Temp 98.6 F (37 C)   Resp 18   Ht 5'  6" (1.676 m)   Wt 75.8 kg   LMP 03/04/2022 (Exact Date)   SpO2 99%   BMI 26.95 kg/m  Physical Exam Vitals and nursing note reviewed.  Constitutional:      General: She is not in acute distress.    Appearance: Normal appearance.  HENT:     Head: Normocephalic and atraumatic.  Pulmonary:     Effort: Pulmonary effort is normal.  Skin:    General: Skin is warm and dry.  Neurological:     Mental Status: She is alert and oriented to person, place, and time.     ED Results / Procedures / Treatments   Labs (all labs ordered are listed, but only abnormal results are displayed) Labs Reviewed - No data to display  EKG None  Radiology No results found.  Procedures Procedures    Medications Ordered in ED Medications - No data to display  ED Course/ Medical Decision Making/ A&P  Patient presenting with back pain and requesting a work note as described in the HPI.  Her symptoms are no worse and I do not feel as though she needs any further workup.  I will provide her with a work note and have her follow-up as needed.  Final Clinical Impression(s) /  ED Diagnoses Final diagnoses:  None    Rx / DC Orders ED Discharge Orders     None         Veryl Speak, MD 04/08/22 [phone]

## 2022-04-08 NOTE — ED Notes (Signed)
Pt A&OX4 ambulatory at d/c with independent steady gait. Pt verbalized understanding of d/c instructions and follow up care. 

## 2022-04-29 ENCOUNTER — Emergency Department (HOSPITAL_BASED_OUTPATIENT_CLINIC_OR_DEPARTMENT_OTHER)
Admission: EM | Admit: 2022-04-29 | Discharge: 2022-04-29 | Disposition: A | Discharge status: Home / Self Care | Payer: BC Managed Care – PPO | Attending: Emergency Medicine | Admitting: Emergency Medicine

## 2022-04-29 ENCOUNTER — Other Ambulatory Visit: Payer: Self-pay

## 2022-04-29 DIAGNOSIS — M5459 Other low back pain: Secondary | ICD-10-CM | POA: Diagnosis not present

## 2022-04-29 DIAGNOSIS — F17210 Nicotine dependence, cigarettes, uncomplicated: Secondary | ICD-10-CM | POA: Diagnosis not present

## 2022-04-29 DIAGNOSIS — J45909 Unspecified asthma, uncomplicated: Secondary | ICD-10-CM | POA: Diagnosis not present

## 2022-04-29 DIAGNOSIS — M545 Low back pain, unspecified: Secondary | ICD-10-CM | POA: Diagnosis not present

## 2022-04-29 MED ORDER — MELOXICAM 15 MG PO TABS: ORAL_TABLET | ORAL | 0 refills | Status: AC

## 2022-04-29 MED ORDER — NAPROXEN 250 MG PO TABS
500.0000 mg | ORAL_TABLET | Freq: Once | ORAL | Status: AC
  Administered 2022-04-29: 500 mg via ORAL
  Filled 2022-04-29: qty 2

## 2022-04-29 NOTE — ED Provider Notes (Signed)
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  She was seen on 04/05/2022 for low back pain associated with lifting a heavy couch.  She was prescribed lidocaine patches, metaxalone, meloxicam but did not get these prescriptions filled because she states she works too many days of the week to take time to go to the pharmacy.  She was seen for this again on 04/08/2022 at which time she requested a work note.  She returns with persistent pain in her mid lower lumbar region radiating to her left SI.  She has no associated sciatica.  She rates her pain as a 6 out of 10.   Past Medical History:  Diagnosis Date   Asthma     No past surgical history on file.  Family History  Problem Relation Age of Onset   Diabetes Other     Social History   Tobacco Use   Smoking status: Every Day    Types: Cigarettes   Smokeless tobacco: Never  Vaping Use   Vaping Use: Former  Substance Use Topics   Alcohol use: Not Currently   Drug use: Not Currently    Types: Marijuana    Prior to Admission medications   Medication Sig Start Date End Date Taking? Authorizing Provider  albuterol (VENTOLIN HFA) 108 (90 Base) MCG/ACT inhaler Inhale 1-2 puffs into the lungs every 6 (six) hours as needed for wheezing or shortness of breath. 03/02/20   Margarette Canada, NP  meloxicam (MOBIC) 15 MG tablet Take 1 tablet daily as needed for back pain. 04/29/22   Edwyna Dangerfield, Jenny Reichmann, MD  Spacer/Aero-Holding Chambers (AEROCHAMBER MV) inhaler Use as instructed 03/02/20   Margarette Canada, NP    Allergies Prednisone and Tramadol   REVIEW OF SYSTEMS  Negative except as noted here or in the History of Present Illness.   PHYSICAL EXAMINATION  Initial Vital Signs Blood pressure (!)  119/59, pulse 68, temperature 97.6 F (36.4 C), temperature source Oral, resp. rate 16, weight 75.8 kg, last menstrual period 03/04/2022, SpO2 100 %.  Examination General: Well-developed, well-nourished female in no acute distress; appearance consistent with age of record HENT: normocephalic; atraumatic Eyes: Normal appearance Neck: supple Heart: regular rate and rhythm Lungs: clear to auscultation bilaterally Abdomen: soft; nondistended; nontender; bowel sounds present Back: Lower lumbar and left SI tenderness Extremities: No deformity; full range of motion Neurologic: Awake, alert and oriented; motor function intact in all extremities and symmetric; no facial droop Skin: Warm and dry Psychiatric: Normal mood and affect   RESULTS  Summary of this visit's results, reviewed and interpreted by myself:   EKG Interpretation  Date/Time:    Ventricular Rate:    PR Interval:    QRS Duration:   QT Interval:    QTC Calculation:   R Axis:     Text Interpretation:         Laboratory Studies: No results found for this or any previous visit (from the past 24 hour(s)). Imaging Studies: No results found.  ED COURSE and MDM  Nursing notes, initial and subsequent vitals signs, including pulse oximetry, reviewed and interpreted by myself.  Vitals:   04/29/22 2320 04/29/22 2321 04/29/22 2322  BP:   (!) 119/59  Pulse:  68  Resp:  16   Temp:  97.6 F (36.4 C)   TempSrc:  Oral   SpO2:  100%   Weight: 75.8 kg     Medications  naproxen (NAPROSYN) tablet 500 mg (has no administration in time range)   We will write the patient a work note for 2 days off and then limit heavy lifting for a week.  We will encourage her to take an NSAID and to follow-up with sports medicine if symptoms do not improve.  PROCEDURES  Procedures   ED DIAGNOSES     ICD-10-CM   1. Acute midline low back pain without sciatica  M54.50          Dallan Schonberg, Jenny Reichmann, MD 04/29/22 2337

## 2022-04-29 NOTE — ED Triage Notes (Signed)
Pt arrives with c/o back pain that has been ongoing. Pt has been seen for the same recently. Pt denies injury. Pt ambulatory to room. Pt states "wants a note for work for restrictions."

## 2022-05-12 DIAGNOSIS — M549 Dorsalgia, unspecified: Secondary | ICD-10-CM | POA: Diagnosis not present

## 2022-05-12 DIAGNOSIS — M5459 Other low back pain: Secondary | ICD-10-CM | POA: Diagnosis not present

## 2022-05-13 ENCOUNTER — Other Ambulatory Visit: Payer: Self-pay | Admitting: Internal Medicine

## 2022-05-13 DIAGNOSIS — M4186 Other forms of scoliosis, lumbar region: Secondary | ICD-10-CM | POA: Diagnosis not present

## 2022-05-13 DIAGNOSIS — Z131 Encounter for screening for diabetes mellitus: Secondary | ICD-10-CM | POA: Diagnosis not present

## 2022-05-13 DIAGNOSIS — J302 Other seasonal allergic rhinitis: Secondary | ICD-10-CM | POA: Diagnosis not present

## 2022-05-13 DIAGNOSIS — J4520 Mild intermittent asthma, uncomplicated: Secondary | ICD-10-CM | POA: Diagnosis not present

## 2022-05-13 DIAGNOSIS — Z13220 Encounter for screening for lipoid disorders: Secondary | ICD-10-CM | POA: Diagnosis not present

## 2022-05-13 DIAGNOSIS — Z0000 Encounter for general adult medical examination without abnormal findings: Secondary | ICD-10-CM | POA: Diagnosis not present

## 2022-05-13 DIAGNOSIS — E559 Vitamin D deficiency, unspecified: Secondary | ICD-10-CM | POA: Diagnosis not present

## 2022-05-14 LAB — COMPLETE METABOLIC PANEL WITH GFR
AG Ratio: 1.4 (calc) (ref 1.0–2.5)
ALT: 17 U/L (ref 6–29)
AST: 22 U/L (ref 10–30)
Albumin: 4.4 g/dL (ref 3.6–5.1)
Alkaline phosphatase (APISO): 39 U/L (ref 31–125)
BUN: 13 mg/dL (ref 7–25)
CO2: 20 mmol/L (ref 20–32)
Calcium: 9.4 mg/dL (ref 8.6–10.2)
Chloride: 106 mmol/L (ref 98–110)
Creat: 0.91 mg/dL (ref 0.50–0.97)
Globulin: 3.2 g/dL (calc) (ref 1.9–3.7)
Glucose, Bld: 78 mg/dL (ref 65–99)
Potassium: 4.2 mmol/L (ref 3.5–5.3)
Sodium: 136 mmol/L (ref 135–146)
Total Bilirubin: 0.7 mg/dL (ref 0.2–1.2)
Total Protein: 7.6 g/dL (ref 6.1–8.1)
eGFR: 86 mL/min/{1.73_m2} (ref >=60)

## 2022-05-14 LAB — CBC
HCT: 38.2 % (ref 35.0–45.0)
Hemoglobin: 13 g/dL (ref 11.7–15.5)
MCH: 34.5 pg — ABNORMAL HIGH (ref 27.0–33.0)
MCHC: 34 g/dL (ref 32.0–36.0)
MCV: 101.3 fL — ABNORMAL HIGH (ref 80.0–100.0)
MPV: 9 fL (ref 7.5–12.5)
Platelets: 354 10*3/uL (ref 140–400)
RBC: 3.77 10*6/uL — ABNORMAL LOW (ref 3.80–5.10)
RDW: 11.5 % (ref 11.0–15.0)
WBC: 5.6 10*3/uL (ref 3.8–10.8)

## 2022-05-14 LAB — LIPID PANEL
Cholesterol: 154 mg/dL (ref <200)
HDL: 86 mg/dL (ref >=50)
LDL Cholesterol (Calc): 55 mg/dL (calc)
Non-HDL Cholesterol (Calc): 68 mg/dL (calc) (ref <130)
Total CHOL/HDL Ratio: 1.8 (calc) (ref <5.0)
Triglycerides: 51 mg/dL (ref <150)

## 2022-05-14 LAB — VITAMIN D 25 HYDROXY (VIT D DEFICIENCY, FRACTURES): Vit D, 25-Hydroxy: 12 ng/mL — ABNORMAL LOW (ref 30–100)

## 2022-06-14 ENCOUNTER — Encounter: Payer: Self-pay | Admitting: *Deleted

## 2022-06-24 DIAGNOSIS — J4520 Mild intermittent asthma, uncomplicated: Secondary | ICD-10-CM | POA: Diagnosis not present

## 2022-06-24 DIAGNOSIS — J302 Other seasonal allergic rhinitis: Secondary | ICD-10-CM | POA: Diagnosis not present

## 2022-06-24 DIAGNOSIS — M4186 Other forms of scoliosis, lumbar region: Secondary | ICD-10-CM | POA: Diagnosis not present

## 2022-07-02 DIAGNOSIS — Z6825 Body mass index (BMI) 25.0-25.9, adult: Secondary | ICD-10-CM | POA: Diagnosis not present

## 2022-07-02 DIAGNOSIS — Z124 Encounter for screening for malignant neoplasm of cervix: Secondary | ICD-10-CM | POA: Diagnosis not present

## 2022-07-02 DIAGNOSIS — Z113 Encounter for screening for infections with a predominantly sexual mode of transmission: Secondary | ICD-10-CM | POA: Diagnosis not present

## 2022-07-02 DIAGNOSIS — Z01419 Encounter for gynecological examination (general) (routine) without abnormal findings: Secondary | ICD-10-CM | POA: Diagnosis not present

## 2022-07-02 DIAGNOSIS — M545 Low back pain, unspecified: Secondary | ICD-10-CM | POA: Diagnosis not present

## 2022-07-14 DIAGNOSIS — L700 Acne vulgaris: Secondary | ICD-10-CM | POA: Diagnosis not present

## 2022-08-06 DIAGNOSIS — L700 Acne vulgaris: Secondary | ICD-10-CM | POA: Diagnosis not present
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
